# Patient Record
Sex: Male | Born: 1970
Health system: Southern US, Community
[De-identification: ages and names within clinical notes are randomized; demographics above are authoritative.]

## PROBLEM LIST (undated history)

## (undated) DIAGNOSIS — E7800 Pure hypercholesterolemia, unspecified: Secondary | ICD-10-CM

## (undated) DIAGNOSIS — M549 Dorsalgia, unspecified: Secondary | ICD-10-CM

## (undated) HISTORY — PX: KNEE SURGERY: SHX244

---

## 1998-08-26 ENCOUNTER — Emergency Department (HOSPITAL_COMMUNITY): Admission: EM | Admit: 1998-08-26 | Discharge: 1998-08-26 | Payer: Self-pay | Admitting: Emergency Medicine

## 1998-08-26 ENCOUNTER — Encounter: Payer: Self-pay | Admitting: Emergency Medicine

## 2003-02-02 ENCOUNTER — Ambulatory Visit (HOSPITAL_COMMUNITY): Admission: RE | Admit: 2003-02-02 | Discharge: 2003-02-02 | Payer: Self-pay | Admitting: Neurosurgery

## 2003-02-02 ENCOUNTER — Encounter: Payer: Self-pay | Admitting: Neurosurgery

## 2003-06-07 ENCOUNTER — Emergency Department (HOSPITAL_COMMUNITY): Admission: EM | Admit: 2003-06-07 | Discharge: 2003-06-08 | Payer: Self-pay | Admitting: Emergency Medicine

## 2005-04-27 ENCOUNTER — Emergency Department (HOSPITAL_COMMUNITY): Admission: EM | Admit: 2005-04-27 | Discharge: 2005-04-27 | Payer: Self-pay | Admitting: Family Medicine

## 2012-09-14 ENCOUNTER — Encounter: Payer: Self-pay | Admitting: Sports Medicine

## 2012-09-14 ENCOUNTER — Ambulatory Visit (INDEPENDENT_AMBULATORY_CARE_PROVIDER_SITE_OTHER): Payer: BC Managed Care – PPO | Admitting: Sports Medicine

## 2012-09-14 VITALS — BP 129/87 | HR 50 | Ht 71.0 in | Wt 174.0 lb

## 2012-09-14 DIAGNOSIS — M25561 Pain in right knee: Secondary | ICD-10-CM

## 2012-09-14 DIAGNOSIS — S76311A Strain of muscle, fascia and tendon of the posterior muscle group at thigh level, right thigh, initial encounter: Secondary | ICD-10-CM | POA: Insufficient documentation

## 2012-09-14 DIAGNOSIS — R0602 Shortness of breath: Secondary | ICD-10-CM

## 2012-09-14 DIAGNOSIS — M25569 Pain in unspecified knee: Secondary | ICD-10-CM | POA: Insufficient documentation

## 2012-09-14 NOTE — Assessment & Plan Note (Signed)
He was given a standard set of hamstring rehabilitation exercises  Since the pain extends down to the pes anserine bursa I think he would benefit from a knee compression sleeve  Check in 6-8 weeks to see how he is responding

## 2012-09-14 NOTE — Patient Instructions (Addendum)
Try using a knee compression sleeve You still have some excess motion medial RT knee Ice after activity Keep knee compression on for 30 mins post exercise OK to wash out in shower  HS exercises  Please follow up in 6 weeks  Thank you for seeing [place] today!

## 2012-09-14 NOTE — Assessment & Plan Note (Signed)
Because he has had relatively recent meniscus surgery 3 years ago we should have him be careful with certain activities His hip and quadriceps strength is excellent I think he would benefit from a compression sleeve particularly when running if that will help keep any swelling out from the medial joint line There is no sign of arthritis at this time  He should modify the pedal position to keep pressure off the knee during biking Avoid Too much standing in the saddle  Work on hamstring strength

## 2012-09-14 NOTE — Progress Notes (Signed)
Patient ID: Mathew Davila, male   DOB: [birth year], 42 y.o.   MRN: [id number]  RT knee had meniscus surgery 3 years ago Hurt for 6 mos after No swelling now No locking or giving way now  Biking - mtn bike first in past year road biking Medial knee pain after fit adjustment He reset this and had less pain Does 80-100 mpw   Uses compression sleeve on rt HS Has pain distal hamstring 30 mile ride/ 20 miles in some pain on a hill  Has noticed some shortness of breath when heart rate gets to 150-160 with exertion Mother and father both healthy and no high risk for HD in family   Physical exam: Athletic and NAD  Heart- RRR no murmur  Rt knee exam: Full knee extension and flexion Small arthroscopy scars on rt knee Clicking on medial joint line with Mcmurray's   Straight leg raise lt- 70 deg, rt 60 deg  Left 85 deg on H test Rt 85 deg on H test Weak on eccentric hamstrings Strong hip abductors   Exercise associated shortness of breath  Because of his desire to run a marathon and also push at a very high level in cycling I think he should do an exercise tolerance test to be sure this shortness of breath is not anything significant  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

## 2012-09-15 ENCOUNTER — Telehealth: Payer: Self-pay | Admitting: *Deleted

## 2012-09-15 NOTE — Telephone Encounter (Signed)
Per Dr. Darrick Penna scheduled pt for stress test 10/13/12 @ 1:30pm at [facility].  Pt notified of appt info.

## 2012-10-13 ENCOUNTER — Ambulatory Visit (HOSPITAL_COMMUNITY)
Admission: RE | Admit: 2012-10-13 | Discharge: 2012-10-13 | Disposition: A | Discharge status: Home / Self Care | Payer: BC Managed Care – PPO | Source: Ambulatory Visit | Attending: Sports Medicine | Admitting: Sports Medicine

## 2012-10-13 ENCOUNTER — Ambulatory Visit (HOSPITAL_BASED_OUTPATIENT_CLINIC_OR_DEPARTMENT_OTHER): Payer: BC Managed Care – PPO | Admitting: Sports Medicine

## 2012-10-13 DIAGNOSIS — R030 Elevated blood-pressure reading, without diagnosis of hypertension: Secondary | ICD-10-CM

## 2012-10-13 DIAGNOSIS — R0602 Shortness of breath: Secondary | ICD-10-CM | POA: Insufficient documentation

## 2012-10-13 NOTE — Progress Notes (Signed)
Patient ID: Mathew Davila, male   DOB: Nov 29, 1970, 42 y.o.   MRN: [id number]  Patient is brought in for an exercise tolerance test today  He is a serious biker whor rides up to 30-50 miles  On long rides  On steep hills and sometimes at the end of hard rides he has started noticing shortness of breath and extreme fatigue. This is not associated with palpitations or chest pain. He notes that this occurs at a heart rate of around 170.  He has a positive family history of hypertension in his grandparents there was diabetes and hypertension.  He has a history of mild mitral valve prolapse.  Examination  No acute distress  Heart reveals a regular rhythm and rate of 56. He does not have a murmur but does have a midsystolic click  Resting EKG reveals nonspecific ST and T wave changes  Bruce protocol ETT  Patient reached a heart rate of 170 and we terminated this test secondary to fatigue  He completed 11 minutes on the treadmill or approximately 13 METS  ST and T wave changes normalized and were unremarkable   no arrhythmia  Heart rate recovery at 1 minute was greater than 20 beats and greater than 50 beats at 2 minutes  Blood pressure maximum was 221/90 after a resting blood pressure of 142/80 After 3 minutes his systolic blood pressure only dropped to 217  Impression  Normal ETT with exception of exaggerated hypertensive response  Fitness level was about 60th percentile for age with an estimated VO2 max of 46

## 2012-10-13 NOTE — Assessment & Plan Note (Addendum)
I would like him to keep one-month of blood pressure data with his home monitor  I reassured him that his stress test was low risk for coronary artery disease  He does have a higher risk of hypertension based on result  I would also like to review his cholesterol as the only result I have seen from 3 years ago was slightly elevated  He does have possible mitral valve prolapse based on exam and based on history

## 2013-03-03 ENCOUNTER — Other Ambulatory Visit: Payer: Self-pay

## 2013-06-23 ENCOUNTER — Encounter (HOSPITAL_COMMUNITY): Payer: Self-pay | Admitting: Emergency Medicine

## 2013-06-23 ENCOUNTER — Emergency Department (HOSPITAL_COMMUNITY)
Admission: EM | Admit: 2013-06-23 | Discharge: 2013-06-23 | Disposition: A | Discharge status: Home / Self Care | Payer: 59 | Source: Home / Self Care | Attending: Emergency Medicine | Admitting: Emergency Medicine

## 2013-06-23 DIAGNOSIS — M5430 Sciatica, unspecified side: Secondary | ICD-10-CM

## 2013-06-23 DIAGNOSIS — M5431 Sciatica, right side: Secondary | ICD-10-CM

## 2013-06-23 HISTORY — DX: Dorsalgia, unspecified: M54.9

## 2013-06-23 MED ORDER — METAXALONE 800 MG PO TABS: 800.0000 mg | ORAL_TABLET | Freq: Three times a day (TID) | ORAL | Status: DC | PRN

## 2013-06-23 MED ORDER — METHYLPREDNISOLONE (PAK) 4 MG PO TABS: ORAL_TABLET | ORAL | Status: DC

## 2013-06-23 NOTE — ED Notes (Signed)
Patient reports crick in right side of neck that started 3 days ago, yesterday started having pain in right lower back.  Sitting is uncomfortable.  Pain radiates into right leg.  No known injury.  Yesterday, after back pain/hip started hurting, patient had a massage.  Today pain continues.  History of back issues

## 2013-06-23 NOTE — ED Provider Notes (Signed)
CSN: [id number]     Arrival date & time 06/23/13  1037 History   First MD Initiated Contact with Patient 06/23/13 1148     Chief Complaint  Patient presents with  . Back Pain     (Consider location/radiation/quality/duration/timing/severity/associated sxs/prior Treatment) HPI Comments: Patient reports lower back (R) muscle spasms with ambulation  Patient is a [age] y.o. male presenting with back pain. The history is provided by the patient.  Back Pain Location:  Sacro-iliac joint Quality:  Cramping Radiates to: right buttock. Pain severity:  Moderate Onset quality:  Gradual Duration:  3 days Timing:  Constant Progression:  Unchanged Chronicity:  New Relieved by: some relief with ibuprofen. Worsened by:  Sitting (and walking) Associated symptoms: no abdominal pain, no bladder incontinence, no bowel incontinence, no dysuria, no fever, no leg pain, no numbness, no paresthesias, no pelvic pain, no perianal numbness, no tingling, no weakness and no weight loss     Past Medical History  Diagnosis Date  . Back pain    History reviewed. No pertinent past surgical history. No family history on file. History  Substance Use Topics  . Smoking status: Never Smoker   . Smokeless tobacco: Never Used  . Alcohol Use: Yes    Review of Systems  Constitutional: Negative for fever and weight loss.  Gastrointestinal: Negative for abdominal pain and bowel incontinence.  Genitourinary: Negative for bladder incontinence, dysuria and pelvic pain.  Musculoskeletal: Positive for back pain.  Neurological: Negative for tingling, weakness, numbness and paresthesias.  All other systems reviewed and are negative.      Allergies  Review of patient's allergies indicates no known allergies.  Home Medications   Current Outpatient Rx  Name  Route  Sig  Dispense  Refill  . metaxalone (SKELAXIN) 800 MG tablet   Oral   Take 1 tablet (800 mg total) by mouth 3 (three) times daily as needed for muscle  spasms.   21 tablet   0   . methylPREDNIsolone (MEDROL DOSPACK) 4 MG tablet      follow package directions   21 tablet   0    BP 142/80  Pulse 56  Temp(Src) 97.7 F (36.5 C) (Oral)  Resp 14  SpO2 100% Physical Exam  Nursing note and vitals reviewed. Constitutional: He is oriented to person, place, and time. He appears well-developed and well-nourished. No distress.  HENT:  Head: Normocephalic and atraumatic.  Eyes: Conjunctivae are normal.  Neck: Normal range of motion. Neck supple.  Cardiovascular: Normal rate, regular rhythm and normal heart sounds.   Pulmonary/Chest: Effort normal and breath sounds normal.  Abdominal: Soft. Bowel sounds are normal. He exhibits no distension. There is no tenderness.  Musculoskeletal: Normal range of motion.       Lumbar back: He exhibits tenderness. He exhibits normal range of motion, no swelling, no edema, no deformity and no laceration.       Back:  Neurological: He is alert and oriented to person, place, and time.  Skin: Skin is warm and dry. No rash noted.  Psychiatric: He has a normal mood and affect. His behavior is normal.    ED Course  Procedures (including critical care time) Labs Review Labs Reviewed - No data to display Imaging Review No results found.    MDM   Final diagnoses:  Sciatica of right side  Sciatica on right: Medrol dose pack, tylenol and skelaxin as directed. Cautioned patient about red flags with regard to lower back pain. States he plans to follow up  with his PCP the first week in March 2015 if symptoms do not improve.    Jess Barters Gordon, [place] 06/23/13 1228

## 2013-06-24 NOTE — ED Provider Notes (Signed)
Medical screening examination/treatment/procedure(s) were performed by non-physician practitioner and as supervising physician I was immediately available for consultation/collaboration.  Blaze Nylund, M.D.  Hayzel Ruberg C Marshay Slates, MD 06/24/13 1236 

## 2013-07-12 ENCOUNTER — Encounter: Payer: Self-pay | Admitting: Sports Medicine

## 2013-07-12 ENCOUNTER — Ambulatory Visit (INDEPENDENT_AMBULATORY_CARE_PROVIDER_SITE_OTHER): Payer: 59 | Admitting: Sports Medicine

## 2013-07-12 VITALS — BP 130/81 | HR 55 | Ht 71.0 in | Wt 174.0 lb

## 2013-07-12 DIAGNOSIS — S76311A Strain of muscle, fascia and tendon of the posterior muscle group at thigh level, right thigh, initial encounter: Secondary | ICD-10-CM

## 2013-07-12 DIAGNOSIS — IMO0002 Reserved for concepts with insufficient information to code with codable children: Secondary | ICD-10-CM

## 2013-07-12 DIAGNOSIS — M25569 Pain in unspecified knee: Secondary | ICD-10-CM

## 2013-07-12 NOTE — Assessment & Plan Note (Signed)
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 Advil prn. F/u if worsens

## 2013-07-12 NOTE — Progress Notes (Signed)
Patient ID: Mathew Davila, male   DOB: 06/25/1970, 43 y.o.   MRN: [id number]    Subjective: HPI: Patient is a 43 y.o. male presenting to clinic today for evaluation of hamstring and knee pain.  1. Hamstring pain - Patient is a cyclist. He has noticed a stiffness/tightness in his right distal hamstring for the last several months. He states it is worse with pushing harder going up hill or with longer rides. He states he changed his bike position prior to his last ride and this helped with some of his pain. He has not noticed any swelling of the leg but it feels tighter to him on palpation. Of note, he also endorses some perineum pain and urinary frequency after rides. He was seen in the urgent care on 2/23 for sciatic pain on the right. He took a steroid dosepak with full resolution.   2. Knee pain - Patient has chronic medial knee pain on the right. He had a bucket handle meniscus tear 5+ years ago with some residual pain. It is exacerbated with cycling as well. No injury, swelling, redness, numbness or tingling. He recently shortened his stride on the bike which did help.   History Reviewed: Non smoker.  ROS: Please see HPI above.  Objective: Office vital signs reviewed. BP 130/81  Pulse 55  Ht 5\' 11"  (1.803 m)  Wt 174 lb (78.926 kg)  BMI 24.28 kg/m2  Physical Examination:  General: Awake, alert. NAD. Able to move on and off table without difficulty  Hips: - Stable - FROM bilaterally  Right leg: - Excellent hamstring strength - Negative H test - TTP of medial side just proximal to popliteal fossa  Right knee: - No effusion, redness or gross deformity - Stable joint - TTP medial joint line and just proximal to that  Neuro: Grossly intact distally  Assessment: 43 y.o. male hamstring area pain that I feel may be 2/2 nerve compression of sciatic with pressure on bike seat  Plan: See Problem List and After Visit Summary

## 2013-07-12 NOTE — Assessment & Plan Note (Signed)
Distal hamstring/tendon pain noticed with exertion on bike. - Adjust position on bike - Consider new seat - Given [facility] for strengthening hamstring - NSAID if pain worsens - Consider adding B6 and Vit C supplements - Establish PCP

## 2013-07-12 NOTE — Patient Instructions (Signed)
Consider getting a new bike seat. Trust the [employer] store for your bike fitting to decrease your stride and pedal placement.  For your hamstring, do the exercises to strengthen your hamstring muscles to keep it healthy. - Extender (lying down and extend leg):  4 sets: 8 reps, hold for count of 5. Two days per week - Diver (Stand on right leg and bend forward towards your toe) 4 sets: 6 reps, hold for count of 5. Do 4 times per week - Glider (Stand on right leg and do a reverse lunge) 3 sets: 8 reps. Three days per week  Try the following supplements: B6 100mg  daily Vitamin C 500mg  daily  Call [facility] to establish care with either Dr. Cleta Albertsaub or Dr. Neva SeatGreene [phone]).

## 2013-08-15 ENCOUNTER — Other Ambulatory Visit: Payer: Self-pay | Admitting: *Deleted

## 2013-08-15 DIAGNOSIS — M25569 Pain in unspecified knee: Secondary | ICD-10-CM

## 2013-08-17 ENCOUNTER — Ambulatory Visit
Admission: RE | Admit: 2013-08-17 | Discharge: 2013-08-17 | Disposition: A | Discharge status: Home / Self Care | Payer: 59 | Source: Ambulatory Visit | Attending: Sports Medicine | Admitting: Sports Medicine

## 2013-08-17 DIAGNOSIS — M25569 Pain in unspecified knee: Secondary | ICD-10-CM

## 2013-08-19 ENCOUNTER — Other Ambulatory Visit: Payer: 59

## 2013-08-22 ENCOUNTER — Ambulatory Visit
Admission: RE | Admit: 2013-08-22 | Discharge: 2013-08-22 | Disposition: A | Discharge status: Home / Self Care | Payer: 59 | Source: Ambulatory Visit | Attending: Sports Medicine | Admitting: Sports Medicine

## 2013-08-22 DIAGNOSIS — M25569 Pain in unspecified knee: Secondary | ICD-10-CM

## 2013-08-30 ENCOUNTER — Ambulatory Visit (INDEPENDENT_AMBULATORY_CARE_PROVIDER_SITE_OTHER): Payer: 59 | Admitting: Sports Medicine

## 2013-08-30 ENCOUNTER — Encounter: Payer: Self-pay | Admitting: Sports Medicine

## 2013-08-30 VITALS — BP 117/75 | Ht 71.0 in | Wt 173.0 lb

## 2013-08-30 DIAGNOSIS — S76311A Strain of muscle, fascia and tendon of the posterior muscle group at thigh level, right thigh, initial encounter: Secondary | ICD-10-CM

## 2013-08-30 DIAGNOSIS — M25569 Pain in unspecified knee: Secondary | ICD-10-CM

## 2013-08-30 DIAGNOSIS — IMO0002 Reserved for concepts with insufficient information to code with codable children: Secondary | ICD-10-CM

## 2013-08-30 DIAGNOSIS — S83241A Other tear of medial meniscus, current injury, right knee, initial encounter: Secondary | ICD-10-CM | POA: Insufficient documentation

## 2013-08-30 NOTE — Progress Notes (Signed)
Patient ID: Mathew Davila, male   DOB: 07/03/1970, 43 y.o.   MRN: [id number]  The patient has had right hamstring pain and medial RT knee pain that started about one year ago The started primarily with an increase in his cycling  He did exercises for his hamstring and improved  Then about 8 weeks ago we saw him for the medial knee pain that did not respond to conservative care For this reason we got an MRI and this did show some new meniscal tearing He has a history of meniscal tear about 5-6 years ago that required removal of significant fragment on the right  He uses the compression sleeve for walking or running and has less knee pain  No mechanical symptoms since last being seen  He is able to do moderate intensity biking without pain  Physical examination No acute distress BP 117/75  Ht 5\' 11"  (1.803 m)  Wt 173 lb (78.472 kg)  BMI 24.14 kg/m2  RT Knee: Normal to inspection with no erythema or effusion or obvious bony abnormalities. Palpation normal with no warmth or joint line tenderness or patellar tenderness or condyle tenderness. ROM normal in flexion and extension and lower leg rotation. Ligaments with solid consistent endpoints including ACL, PCL, LCL, MCL. Negative Mcmurray's and provocative meniscal tests. He does get some crepitation when I test his medial joint line Non painful patellar compression. Patellar and quadriceps tendons unremarkable. Hamstring and quadriceps strength is normal.  MRI is reviewed with the patient and shows only a small effusion but a new meniscal tear

## 2013-08-30 NOTE — Assessment & Plan Note (Signed)
Pain continues to improve  Use the compression sleeve and icing  Over-the-counter medications if needed

## 2013-08-30 NOTE — Assessment & Plan Note (Signed)
Since this seems to be gradually improving with no instability or mechanical symptoms I think he can continue conservative care  I want him to keep his biking to moderate level for the next 2-3 months  We should recheck him at that time to see if he is ready to increase his training

## 2013-08-30 NOTE — Patient Instructions (Signed)
Keep up some hamstring exercise  Keep biking to a moderate level for 6 to 12 weeks Riding is good therapy But don't push gears Don't stand for very long Run on softer surface if you can  Avoid deep knee bend Use compression more for walking or standing too long For biking use only in recovery  See me in ~ 8 weeks before any hard training

## 2013-08-30 NOTE — Assessment & Plan Note (Signed)
Keep up some maintenance exercises for his hamstring strength

## 2013-10-25 ENCOUNTER — Other Ambulatory Visit: Payer: 59 | Admitting: Sports Medicine

## 2013-11-01 ENCOUNTER — Ambulatory Visit (INDEPENDENT_AMBULATORY_CARE_PROVIDER_SITE_OTHER): Payer: 59 | Admitting: Sports Medicine

## 2013-11-01 ENCOUNTER — Encounter: Payer: Self-pay | Admitting: Sports Medicine

## 2013-11-01 VITALS — BP 114/71 | Ht 71.0 in | Wt 172.0 lb

## 2013-11-01 DIAGNOSIS — M25561 Pain in right knee: Secondary | ICD-10-CM

## 2013-11-01 DIAGNOSIS — Z5189 Encounter for other specified aftercare: Secondary | ICD-10-CM

## 2013-11-01 DIAGNOSIS — M25569 Pain in unspecified knee: Secondary | ICD-10-CM

## 2013-11-01 DIAGNOSIS — S83241D Other tear of medial meniscus, current injury, right knee, subsequent encounter: Secondary | ICD-10-CM

## 2013-11-01 DIAGNOSIS — IMO0002 Reserved for concepts with insufficient information to code with codable children: Secondary | ICD-10-CM

## 2013-11-01 NOTE — Progress Notes (Signed)
Patient ID: Mathew Davila, male   DOB: 06/20/1970, 43 y.o.   MRN: [id number]  The patient has had medial RT knee pain that started about one year ago The started primarily with an increase in his cycling MRI and this did show some new meniscal tearing  He has a history of meniscal tear about 5-6 years ago that required removal of significant fragment on the right  He uses the compression sleeve for walking or running and has less knee pain No mechanical symptoms since last being seen He has been able to increase his cycling distance to 50 miles max currently with minimal pain. He reports no joint effusion present or soft tissue edema.  His hamstring weakness and pain resolved   Physical examination No acute distress BP 114/71  Ht 5\' 11"  (1.803 m)  Wt 172 lb (78.019 kg)  BMI 24.00 kg/m2  RT Knee: Normal to inspection with no erythema or effusion or obvious bony abnormalities. Palpation normal with no warmth or joint line tenderness or patellar tenderness or condyle tenderness. ROM normal in flexion 145 deg and extension 0 deg and lower leg rotation. Ligaments with solid consistent endpoints including ACL, PCL, LCL, MCL. Negative Mcmurray's and provocative meniscal tests.  He does get some crepitation along the medial joint line Non painful patellar compression. Patellar and quadriceps tendons unremarkable. Hamstring and quadriceps strength is normal.   Assessment: Medial mensicus tear with joint effusion from biking. Patient effusion and pain have resolved he has been able to gradually increase his mileage and gears. Recommend releasing him back to normal activity. Recommend continue gradual increase in miles on biking, can return to running with minimal hills and stairs.  Plan/ instructions: Keep up some hamstring exercise  Keep biking with gradual increase in miles Riding is good therapy  Run on softer surface if you can  Avoid deep knee bend  Use compression more for  walking or standing too long  Stretch well after biking

## 2013-11-01 NOTE — Assessment & Plan Note (Signed)
Clinically improved with conservative Tx  Will follow  Reck if return of sxs

## 2013-11-01 NOTE — Assessment & Plan Note (Signed)
Assessment: Medial mensicus tear with joint effusion from biking. Patient effusion and pain have resolved he has been able to gradually increase his mileage and gears. Recommend releasing him back to normal activity. Recommend continue gradual increase in miles on biking, can return to running with minimal hills and stairs.  Plan/ instructions: Keep up some hamstring exercise  Keep biking with gradual increase in miles Riding is good therapy  Run on softer surface if you can  Avoid deep knee bend  Use compression more for walking or standing too long  Stretch well after biking

## 2014-01-26 ENCOUNTER — Encounter: Payer: Self-pay | Admitting: Sports Medicine

## 2014-01-26 ENCOUNTER — Ambulatory Visit (INDEPENDENT_AMBULATORY_CARE_PROVIDER_SITE_OTHER): Payer: 59 | Admitting: Sports Medicine

## 2014-01-26 VITALS — BP 132/82 | HR 51 | Ht 71.0 in | Wt 173.0 lb

## 2014-01-26 DIAGNOSIS — S4351XA Sprain of right acromioclavicular joint, initial encounter: Secondary | ICD-10-CM | POA: Insufficient documentation

## 2014-01-26 DIAGNOSIS — M25511 Pain in right shoulder: Secondary | ICD-10-CM

## 2014-01-26 MED ORDER — AMITRIPTYLINE HCL 25 MG PO TABS: 25.0000 mg | ORAL_TABLET | Freq: Every day | ORAL | Status: DC
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
## 2014-01-26 NOTE — Assessment & Plan Note (Signed)
An effusion was noted to be around the [facility] joint indicating that his pain is most likely the result of an AC sprain. He had some mild weakness on external rotation but no tears were noted on US. Nothing observed on US or on exam to suggest Thoracic outlet syndrome.  - amitriptyline 25 mg QHS #30  - home exercises given  - advised he may exercise but limited activity with weights above his head and stop anytime pain occurs.  - f/u in 6 weeks to rescan and determine improvement of symptoms.

## 2014-02-22 ENCOUNTER — Ambulatory Visit: Payer: 59 | Admitting: Sports Medicine

## 2015-10-25 ENCOUNTER — Ambulatory Visit (INDEPENDENT_AMBULATORY_CARE_PROVIDER_SITE_OTHER): Payer: 59 | Admitting: Sports Medicine

## 2015-10-25 ENCOUNTER — Encounter: Payer: Self-pay | Admitting: Sports Medicine

## 2015-10-25 VITALS — BP 122/79 | HR 49 | Ht 71.0 in | Wt 175.0 lb

## 2015-10-25 DIAGNOSIS — S4351XA Sprain of right acromioclavicular joint, initial encounter: Secondary | ICD-10-CM | POA: Diagnosis not present

## 2015-10-25 MED ORDER — DICLOFENAC SODIUM 1 % TD GEL
2.0000 g | Freq: Four times a day (QID) | TRANSDERMAL | Status: DC
Start: 2015-10-25 — End: 2017-06-12

## 2015-10-25 NOTE — Progress Notes (Signed)
  Subjective:   Mathew Davila is a 45 y.o. male with a history of R AC joint sprain in 2015 here for R shoulder pain.  Patient was feeling well and began weight lifting again 5-6 months ago.  He noted pain in R anterior shoulder that felt similar to previous pain with AC joint sprain ~3-4 months ago, especially with overhead press, fly, and bench press.  He stopped lifting weights about 1 month ago.  He is still having pain, especially when laying on R side during sleep.  This will awaken him from sleep.  He has not tried any medications or ice.Denies neck pain, weakness.  Does have intermittent tingling in hands, present prior to shoulder pain.  Review of Systems:  Per HPI.   Social History: never smoker  Objective:  BP 122/79 mmHg  Pulse 49  Ht 5\' 11"  (1.803 m)  Wt 175 lb (79.379 kg)  BMI 24.42 kg/m2  Gen:  45 y.o. male in NAD, fit appearing MSK: R Shoulder: Inspection reveals no abnormalities, atrophy or asymmetry. Palpation is normal with no tenderness over AC joint or bicipital groove. ROM is full in all planes. Rotator cuff strength normal throughout. No signs of impingement with negative Neer's tests, empty can. Speeds and Yergason's tests normal. + crossover test + resisted shoulder adduction test  MSK US R shoulder (10/25/2015): R shoulder with intact rotator cuff tendons.  Small effusion and sprain (clavicle riding higher than acromion) of R AC joint, not present in L shoulder.      Assessment & Plan:     Mathew Davila is a 45 y.o. male here for R shoulder AC joint sprain.  Sprain of right acromioclavicular joint US and special testing on exam consistent with AC joint sprain of R shoulder Rotator cuff intact Advised no overhead lifting and wide grips for bench press Avoid sleeping on this shoulder Voltaren gel qid Ice after workouts F/u in 6 wks if not better and consider cortisone injection at that time    Erasmo DownerAngela M Mahaley Schwering, MD MPH PGY-2,   [facility] 10/25/2015  4:55 PM    Agree with assessment and evaluation.  Sterling BigKB Fields, MD

## 2015-10-25 NOTE — Assessment & Plan Note (Addendum)
US and special testing on exam consistent with chronic AC joint sprain of R shoulder/ probable early DJD Rotator cuff intact Advised no overhead lifting and wide grips for bench press Avoid sleeping on this shoulder Voltaren gel qid Ice after workouts F/u in 6 wks if not better and consider cortisone injection at that time

## 2015-11-28 ENCOUNTER — Encounter: Payer: Self-pay | Admitting: Sports Medicine

## 2015-11-28 ENCOUNTER — Ambulatory Visit (INDEPENDENT_AMBULATORY_CARE_PROVIDER_SITE_OTHER): Payer: 59 | Admitting: Sports Medicine

## 2015-11-28 DIAGNOSIS — S4351XD Sprain of right acromioclavicular joint, subsequent encounter: Secondary | ICD-10-CM

## 2015-11-28 MED ORDER — TRIAMCINOLONE ACETONIDE 10 MG/ML IJ SUSP
5.0000 mg | Freq: Once | INTRAMUSCULAR | Status: AC
  Administered 2015-11-28: 5 mg via INTRA_ARTICULAR

## 2015-11-28 NOTE — Assessment & Plan Note (Signed)
Injection performed today  - f/u in 6 weeks to re-scan his [facility] joint to monitor for improvement.

## 2015-11-28 NOTE — Progress Notes (Signed)
  Mathew Davila - 45 y.o. male MRN [id number]  Date of birth: 1970/08/04  Patient is following up in regards to his [facility] joint separation, please refer to note from 10/25/15 for history and exam.   Aspiration/Injection Procedure Note Mathew Davila [birth year]  Procedure: Injection Indications: AC joint separation  Procedure Details Consent: Risks of procedure as well as the alternatives and risks of each were explained to the (patient/caregiver).  Consent for procedure obtained. Time Out: Verified patient identification, verified procedure, site/side was marked, verified correct patient position, special equipment/implants available, medications/allergies/relevent history reviewed, required imaging and test results available.  Performed.  The area was cleaned with iodine and alcohol swabs.    The right AC joint was injected using 0.5 cc's of 40 mg Depomedrol and 1 cc's of 1% lidocaine with a 30 1" needle.  Ultrasound was used. Images were obtained in Transverse view showing the injection.    Patient did tolerate procedure well.  ASSESSMENT & PLAN:   Sprain of right acromioclavicular joint Injection performed today  - f/u in 6 weeks to re-scan his [facility] joint to monitor for improvement.

## 2017-05-05 ENCOUNTER — Encounter (HOSPITAL_COMMUNITY): Payer: Self-pay | Admitting: Family Medicine

## 2017-05-05 ENCOUNTER — Ambulatory Visit (HOSPITAL_COMMUNITY)
Admission: EM | Admit: 2017-05-05 | Discharge: 2017-05-05 | Disposition: A | Discharge status: Home / Self Care | Payer: 59 | Attending: Emergency Medicine | Admitting: Emergency Medicine

## 2017-05-05 DIAGNOSIS — R42 Dizziness and giddiness: Secondary | ICD-10-CM | POA: Diagnosis not present

## 2017-05-05 DIAGNOSIS — R0602 Shortness of breath: Secondary | ICD-10-CM

## 2017-05-05 LAB — GLUCOSE, CAPILLARY: Glucose-Capillary: 106 mg/dL — ABNORMAL HIGH (ref 65–99)

## 2017-05-05 NOTE — ED Triage Notes (Signed)
Pt here for dizziness, SOB, body aches, and nausea since last night.

## 2017-05-05 NOTE — Discharge Instructions (Signed)
Please continue to monitor your symptoms for changes. Please come back or go to the emergency room if symptoms not improving or worsening, develop shortness of breath, chest pain, increased dizziness or lightheadedness.

## 2017-05-05 NOTE — ED Provider Notes (Signed)
[facility]    CSN: [id number] Arrival date & time: 05/05/17  1206     History   Chief Complaint Chief Complaint  Patient presents with  . Nausea  . Generalized Body Aches  . Shortness of Breath    HPI Mathew Davila is a 47 y.o. male presenting with acute onset last night of nausea, shortness or breath and dizziness. This awakened him from sleep and he felt winded. He states he felt he had to think about breathing. Dizziness was more associated with shortness of breath, denies room spinning, denies syncope. Believe he may have possible sleep apnea. He was able to go back to sleep for 2-3 hours and when he woke up his symptoms had improved, but still lingering. He still feels some SOB and occasional dizziness. Denies smoking, cocaine use. Does endorse occasional marijuana vaping to try and help with sleep as he has persistent issues with sleep. No personal history of high blood pressure, although he states readings have been elevated in past but was going to try lifestyle modifications to bring back down. Denies DM. Does have a family history of high blood pressure. Neither parent have had CAD/MI, grandfather has had an MI in his 41's. Does feel he has had more stress of recently due to children and holidays. Denies chest pain during these episodes. No recent illnesses.  HPI  Past Medical History:  Diagnosis Date  . Back pain     Patient Active Problem List   Diagnosis Date Noted  . Sprain of right acromioclavicular joint 01/26/2014  . Acute medial meniscus tear of right knee 08/30/2013  . Blood pressure elevated without history of HTN 10/13/2012  . Medial knee pain 09/14/2012  . Right hamstring muscle strain 09/14/2012    History reviewed. No pertinent surgical history.     Home Medications    Prior to Admission medications   Medication Sig Start Date End Date Taking? Authorizing Provider  diclofenac sodium (VOLTAREN) 1 % GEL Apply 2 g topically 4  (four) times daily. 10/25/15   Enid Baas, MD  finasteride (PROPECIA) 1 MG tablet Take 1 mg by mouth.    [provider]  ibuprofen (ADVIL,MOTRIN) 200 MG tablet Take 200 mg by mouth every 6 (six) hours as needed.    [provider]    Family History History reviewed. No pertinent family history.  Social History Social History   Tobacco Use  . Smoking status: Never Smoker  . Smokeless tobacco: Never Used  Substance Use Topics  . Alcohol use: Yes  . Drug use: No     Allergies   Patient has no known allergies.   Review of Systems Review of Systems  Constitutional: Negative for chills and fever.  HENT: Negative for congestion, ear pain and sore throat.   Eyes: Negative for pain and visual disturbance.  Respiratory: Positive for shortness of breath. Negative for cough.   Cardiovascular: Negative for chest pain and palpitations.  Gastrointestinal: Positive for nausea. Negative for abdominal pain and vomiting.  Musculoskeletal: Negative for arthralgias and back pain.  Skin: Negative for color change and rash.  Neurological: Positive for dizziness and light-headedness. Negative for syncope and weakness.  All other systems reviewed and are negative.    Physical Exam Triage Vital Signs ED Triage Vitals [05/05/17 1238]  Enc Vitals Group     BP (!) 149/83     Pulse Rate 73     Resp 18     Temp (!) 97.4 F (36.3  C)     Temp Source Oral     SpO2 100 %     Weight      Height      Head Circumference      Peak Flow      Pain Score      Pain Loc      Pain Edu?      Excl. in GC?    No data found.  Updated Vital Signs BP (!) 149/83 (BP Location: Left Arm)   Pulse 73   Temp (!) 97.4 F (36.3 C) (Oral)   Resp 18   SpO2 100%    Physical Exam  Constitutional: He appears well-developed and well-nourished.  HENT:  Head: Normocephalic and atraumatic.  Right Ear: Tympanic membrane and ear canal normal.  Left Ear: Tympanic membrane and ear canal  normal.  Mouth/Throat: Oropharynx is clear and moist.  Eyes: Conjunctivae and EOM are normal. Pupils are equal, round, and reactive to light.  Neck: Neck supple.  Cardiovascular: Normal rate and regular rhythm.  No murmur heard. Pulmonary/Chest: Effort normal and breath sounds normal. No respiratory distress. He has no decreased breath sounds. He has no wheezes. He has no rhonchi. He has no rales.  Breathing comfortably at rest, does not appear to be consciously thinking about breathing  Abdominal: Soft. He exhibits no distension. There is no tenderness. There is no guarding.  Musculoskeletal: He exhibits no edema.  Neurological: He is alert. No cranial nerve deficit.  Skin: Skin is warm and dry.  Psychiatric: He has a normal mood and affect.  Nursing note and vitals reviewed.    UC Treatments / Results  Labs (all labs ordered are listed, but only abnormal results are displayed) Labs Reviewed  GLUCOSE, CAPILLARY - Abnormal; Notable for the following components:      Result Value   Glucose-Capillary 106 (*)    All other components within normal limits    EKG  EKG Interpretation None      EKG with t-wave inversions in leads II, III, avF and V4/V5. Non-specific t-wave inversions present on Stress test 3 years ago- inversions larger today.   Radiology No results found.  Procedures Procedures (including critical care time)  Medications Ordered in UC Medications - No data to display   Initial Impression / Assessment and Plan / UC Course  I have reviewed the triage vital signs and the nursing notes.  Pertinent labs & imaging results that were available during my care of the patient were reviewed by me and considered in my medical decision making (see chart for details).     Patient presenting with acute episode of SOB, nausea and dizziness. Unclear cause of symptoms, may be related to sleep apnea, advised to follow up with PCP to discuss need for sleep study/referral. EKG  with t-wave inversions, appear to be present on previous stress test, negative risk factors except for possible undiagnosed HTN. Episodes not associated with chest pain- patient more concerned about breathing. No cough, no swelling, no recent illness, O2 saturation of 100% without effort- unlikely intrapulmonary cause. Advised to monitor symptoms, Discussed strict return precautions. Patient verbalized understanding and is agreeable with plan.   Final Clinical Impressions(s) / UC Diagnoses   Final diagnoses:  Shortness of breath    ED Discharge Orders    None       Controlled Substance Prescriptions [place] Controlled Substance Registry consulted? Not Applicable   Lew DawesWieters, Yvette Loveless C, [place] JerseyPA-C 05/05/17 1636

## 2017-06-12 ENCOUNTER — Ambulatory Visit: Payer: 59 | Admitting: Physician Assistant

## 2017-06-12 ENCOUNTER — Encounter: Payer: Self-pay | Admitting: Physician Assistant

## 2017-06-12 VITALS — BP 126/78 | HR 53 | Ht 71.0 in | Wt 185.6 lb

## 2017-06-12 DIAGNOSIS — R0602 Shortness of breath: Secondary | ICD-10-CM | POA: Diagnosis not present

## 2017-06-12 DIAGNOSIS — R079 Chest pain, unspecified: Secondary | ICD-10-CM

## 2017-06-12 NOTE — Progress Notes (Signed)
Cardiology Office Note    Date:  06/13/2017   ID:  Mathew Davila, DOB 10/19/1970, MRN [id number]  PCP:  Mathew Heys, MD  Cardiologist:  New - remotely seen by Dr. Jens Davila, case discussed with DOD DR. [place]   Chief Complaint  Patient presents with  . New Patient (Initial Visit)    case discussed with DOD Dr. [place]    History of Present Illness:  Mathew Davila is a 47 y.o. male with no past medical history who presented for evaluation of shortness of breath that wake him up from sleep at night.  He is extremely active and just finished 22 mile bicycle exercise yesterday.  He plans to ride his road bike again as afternoon.  He was remotely seen by Dr. Jens Davila in the past, medical record is not available as it was before the electronic medical record system.  He apparently had a brief episode of bigeminy, however this has resolved without further recurrence since.  He also mentioned a possible infection of the lining of the heart more than 15 years ago, I am not sure if he is describing something like pericarditis.  He has remote family history of CAD on his father's side among his uncle and grandparents, but both his father and mother are living and healthy except his father has hypertension.  He himself denies any high blood pressure, high cholesterol and diabetes.  He does have occasional sharp chest pain which only last a few seconds each time and may not occur but once a month.  It does not occur with exertion and so far he has not experienced any episode longer than a minute.  More recently, he went to the local urgent care on 05/05/2017 for evaluation of shortness of breath that woke him up at night.  He says the symptoms lasted a few minutes before he went back to sleep.  He is wondering if he has obstructive sleep apnea.  He is very fit, although this cannot completely rule out potential structural abnormality in the back of throat can cause snoring issue.  I recommended  him to discuss this with his primary care provider.  He does not appears to have any heart failure signs and symptoms on physical exam.  However his EKG obtained by urgent care does show significant T wave inversions in inferolateral leads.  I discussed his case with DOD Dr. [place], we recommend a stress echocardiogram.  If stress echocardiogram is negative, no further workup is needed.   Past Medical History:  Diagnosis Date  . Back pain     Past Surgical History:  Procedure Laterality Date  . KNEE SURGERY Right    right meniscus    Current Medications: Outpatient Medications Prior to Visit  Medication Sig Dispense Refill  . finasteride (PROPECIA) 1 MG tablet Take 1 mg by mouth.    Marland Kitchen ibuprofen (ADVIL,MOTRIN) 200 MG tablet Take 200 mg by mouth every 6 (six) hours as needed.    . diclofenac sodium (VOLTAREN) 1 % GEL Apply 2 g topically 4 (four) times daily. 100 g 1   No facility-administered medications prior to visit.      Allergies:   Patient has no known allergies.   Social History   Socioeconomic History  . Marital status: Married    Spouse name: None  . Number of children: None  . Years of education: None  . Highest education level: None  Social Needs  . Financial resource strain: None  . Food  insecurity - worry: None  . Food insecurity - inability: None  . Transportation needs - medical: None  . Transportation needs - non-medical: None  Occupational History  . None  Tobacco Use  . Smoking status: Never Smoker  . Smokeless tobacco: Never Used  Substance and Sexual Activity  . Alcohol use: Yes  . Drug use: No  . Sexual activity: None  Other Topics Concern  . None  Social History Narrative  . None     Family History:  The patient's family history includes Heart attack (age of onset: 5464) in his paternal grandfather; Hypertension in his father.   ROS:   Please see the history of present illness.    ROS All other systems reviewed and are  negative.   PHYSICAL EXAM:   VS:  BP 126/78   Pulse (!) 53   Ht 5\' 11"  (1.803 m)   Wt 185 lb 9.6 oz (84.2 kg)   BMI 25.89 kg/m    GEN: Well nourished, well developed, in no acute distress  HEENT: normal  Neck: no JVD, carotid bruits, or masses Cardiac: RRR; no murmurs, rubs, or gallops,no edema  Respiratory:  clear to auscultation bilaterally, normal work of breathing GI: soft, nontender, nondistended, + BS MS: no deformity or atrophy  Skin: warm and dry, no rash Neuro:  Alert and Oriented x 3, Strength and sensation are intact Psych: euthymic mood, full affect  Wt Readings from Last 3 Encounters:  06/12/17 185 lb 9.6 oz (84.2 kg)  11/28/15 175 lb (79.4 kg)  10/25/15 175 lb (79.4 kg)      Studies/Labs Reviewed:   EKG:  EKG is not ordered today.    Recent Labs: No results found for requested labs within last 8760 hours.   Lipid Panel No results found for: CHOL, TRIG, HDL, CHOLHDL, VLDL, LDLCALC, LDLDIRECT  Additional studies/ records that were reviewed today include:   Previous note by local urgent care and EKG obtained at Urgent Care   ASSESSMENT:    1. Shortness of breath   2. Chest pain, unspecified type      PLAN:  In order of problems listed above:  1. Episodic shortness of breath: Although symptom of periodic shortness of breath that wake him up sounds like PND, however he does not have any heart failure signs on physical exam.  He is worried about possible obstructive sleep apnea.  Mr. Mathew SomCrenshaw is extremely fit and ride bicycles almost on a daily basis.  Although I cannot rule out possible structural abnormality in the back of his throat that is capable of causing snoring issues.  I asked him to discuss this with his primary care provider.  2. Chest pain: Suspicion of ACS is extremely low.  Patient does not have significant cardiac risk factors.  He does not have any hypertension, hyperlipidemia or diabetes.  His symptoms seems to be quite atypical and  lasting only seconds at a time.  I discussed the case with DOD Dr. [place], given episodic shortness of breath and chest pain, I recommended a stress echocardiogram.  If stress echocardiogram is normal, I would not recommend further workup in this case.    Medication Adjustments/Labs and Tests Ordered: Current medicines are reviewed at length with the patient today.  Concerns regarding medicines are outlined above.  Medication changes, Labs and Tests ordered today are listed in the Patient Instructions below. Patient Instructions  Medication Instructions: Your physician recommends that you continue on your current medications as directed. Please refer to  the Current Medication list given to you today.   Testing/Procedures: Your physician has requested that you have a stress echocardiogram. For further information please visit [email or web address] Please follow instruction sheet as given.  Follow-Up: Your physician recommends that you schedule a follow-up appointment as needed if normal results. We will call you with the results and let you know when or if follow-up is needed.       Ramond Dial, [place]  06/13/2017 3:44 PM    [facility] [address] [place], [place], [place]  [address] Phone: [phone]; Fax: [phone]

## 2017-06-12 NOTE — Patient Instructions (Signed)
Medication Instructions: Your physician recommends that you continue on your current medications as directed. Please refer to the Current Medication list given to you today.   Testing/Procedures: Your physician has requested that you have a stress echocardiogram. For further information please visit [email or web address]. Please follow instruction sheet as given.  Follow-Up: Your physician recommends that you schedule a follow-up appointment as needed if normal results. We will call you with the results and let you know when or if follow-up is needed.

## 2017-06-13 ENCOUNTER — Encounter: Payer: Self-pay | Admitting: Physician Assistant

## 2017-06-25 ENCOUNTER — Telehealth (HOSPITAL_COMMUNITY): Payer: Self-pay | Admitting: *Deleted

## 2017-06-25 NOTE — Telephone Encounter (Signed)
Patient given detailed instructions per Stress Test Requisition Sheet for test on 07/01/17 at 2:30.Patient Notified to arrive 30 minutes early, and that it is imperative to arrive on time for appointment to keep from having the test rescheduled.  Patient verbalized understanding. Daneil DolinSharon S Brooks

## 2017-07-01 ENCOUNTER — Ambulatory Visit (HOSPITAL_BASED_OUTPATIENT_CLINIC_OR_DEPARTMENT_OTHER): Payer: 59

## 2017-07-01 ENCOUNTER — Ambulatory Visit (HOSPITAL_COMMUNITY): Payer: 59 | Attending: Cardiology

## 2017-07-01 DIAGNOSIS — R079 Chest pain, unspecified: Secondary | ICD-10-CM | POA: Diagnosis not present

## 2017-07-01 DIAGNOSIS — R0602 Shortness of breath: Secondary | ICD-10-CM | POA: Diagnosis not present

## 2017-07-01 NOTE — Progress Notes (Signed)
Normal stress echo, normal pumping function of heart. Followup with cardiology only on an as needed basis

## 2018-02-25 ENCOUNTER — Ambulatory Visit: Payer: Self-pay | Admitting: Sports Medicine

## 2018-03-02 ENCOUNTER — Ambulatory Visit (INDEPENDENT_AMBULATORY_CARE_PROVIDER_SITE_OTHER): Payer: BLUE CROSS/BLUE SHIELD | Admitting: Sports Medicine

## 2018-03-02 VITALS — BP 126/82 | Ht 70.0 in | Wt 176.0 lb

## 2018-03-02 DIAGNOSIS — M5010 Cervical disc disorder with radiculopathy, unspecified cervical region: Secondary | ICD-10-CM

## 2018-03-02 DIAGNOSIS — M542 Cervicalgia: Secondary | ICD-10-CM | POA: Diagnosis not present

## 2018-03-02 MED ORDER — GABAPENTIN 300 MG PO CAPS: 300.0000 mg | ORAL_CAPSULE | Freq: Every evening | ORAL | 1 refills | Status: DC | PRN

## 2018-03-02 MED ORDER — PREDNISONE 10 MG PO TABS: ORAL_TABLET | ORAL | 0 refills | Status: DC

## 2018-03-03 ENCOUNTER — Encounter: Payer: Self-pay | Admitting: Sports Medicine

## 2018-03-03 NOTE — Progress Notes (Signed)
   Subjective:    Patient ID: Mathew Davila, male    DOB: 1970/08/16, 47 y.o.   MRN: [id number]  HPI chief complaint: Neck pain  Very pleasant 47 year old male comes in today complaining of 2 weeks of right-sided neck pain.  He woke up one morning with the pain.  It has intensified over the past couple of weeks.  He describes an aching discomfort along the right trapezius with radiating discomfort down the arm into the radial aspect of the right hand.  He does endorse numbness and tingling into his hand and fingers.  He describes the pain in his right arm as a "charley horse type of feeling".  He is tried over-the-counter Advil without any symptom relief.  He enjoys cycling and kickboxing but has stopped both of those activities.  He had neck pain and left arm numbness in 2004.  An MRI done at that time showed a small focal central disc protrusion at C5-C6 as well as a small disc bulge at C6-C7.  No surgery was performed.  He has done well with his neck up until recently.  He is having difficulty sleeping due to his pain.  He denies weakness.  He is right-hand dominant.  Past medical history reviewed Medications reviewed Allergies reviewed    Review of Systems    As above Objective:   Physical Exam  Well-developed, well-nourished.  No acute distress.  Awake alert and oriented x3.  Vital signs reviewed  Cervical spine: Patient does have some slightly limited cervical rotation to the right.  Full cervical rotation to the left.  Good flexion and extension.  Positive Spurling's to the right.  No tenderness along the cervical midline.  There is tenderness to palpation diffusely along the right paraspinal musculature and trapezius.  Neurological exam: Strength is 5/5 bilaterally in both upper extremities.  Reflexes are equal at the biceps, triceps, and brachioradialis tendons bilaterally.  Sensation is intact to light touch grossly.  No atrophy.  Good pulses.       Assessment & Plan:    Right-sided neck and arm pain likely secondary to cervical radiculopathy  6-day Sterapred Dosepak to take as directed.  Gabapentin 300 mg nightly as needed.  Physical therapy at [facility] outpatient PT where I would like them to try a trial of traction in addition to modalities and exercises.  Phone follow-up with me in 1 week.  If symptoms persist, consider imaging in the form of x-rays and MRI to rule out a herniated cervical disc.  Patient is in agreement with this plan.

## 2018-03-04 ENCOUNTER — Encounter: Payer: Self-pay | Admitting: Physical Therapy

## 2018-03-04 ENCOUNTER — Ambulatory Visit: Payer: BLUE CROSS/BLUE SHIELD | Attending: Sports Medicine | Admitting: Physical Therapy

## 2018-03-04 ENCOUNTER — Other Ambulatory Visit: Payer: Self-pay

## 2018-03-04 DIAGNOSIS — M5412 Radiculopathy, cervical region: Secondary | ICD-10-CM | POA: Diagnosis present

## 2018-03-04 NOTE — Therapy (Signed)
[facility] [address] [place], [place], [address] Phone: [phone]   Fax:  [phone]  Physical Therapy Evaluation  Patient Details  Name: Mathew Davila MRN: [id number] Date of Birth: 1970/07/30 Referring Provider (PT): Reino Bellis, DO   Encounter Date: 03/04/2018  PT End of Session - 03/04/18 2010    Visit Number  1    Number of Visits  8    Date for PT Re-Evaluation  04/01/18    Authorization Type  [employer]    PT Start Time  1503    PT Stop Time  1543    PT Time Calculation (min)  40 min    Activity Tolerance  --   Fair tx. tolerance with discomfort in supine during traction   Behavior During Therapy  [facility] for tasks assessed/performed       Past Medical History:  Diagnosis Date  . Back pain     Past Surgical History:  Procedure Laterality Date  . KNEE SURGERY Right    right meniscus    There were no vitals filed for this visit.   Subjective Assessment - 03/04/18 1547    Subjective  Pt. had onset right sided cervical and right UE radiating pain and parasthesias about 2 1/2 weeks ago. No mechanism of injury-woke up with pain. Pt. saw Dr. Margaretha Sheffield with current PT referral to include trial traction-plan is to try PT and if symptoms persist will get MRI. Pt. has past history left UE radicular symptoms in 2004 resolved at the time with MRI findings of disc bulge.    Limitations  House hold activities;Sitting   sleep disturbance   How long can you sit comfortably?  unable comfortably    How long can you stand comfortably?  no limitations    How long can you walk comfortably?  no limitations    Diagnostic tests  past MRI 2004    Currently in Pain?  Yes    Pain Score  4     Pain Location  Neck    Pain Orientation  Right    Pain Descriptors / Indicators  Aching    Pain Type  Acute pain    Pain Radiating Towards  Right upper trapezius and right arm distally to hand in radial nerve distribution    Pain Onset  1  to 4 weeks ago    Pain Frequency  Constant    Aggravating Factors   supine positioning otherwise no aggs noted    Pain Relieving Factors  medication    Effect of Pain on Daily Activities  sleep disturbance and decreased tolerance ADLs/IADLs         [facility] PT Assessment - 03/04/18 0001      Assessment   Medical Diagnosis  Right sided neck pain/suspected cervical radiculopathy    Referring Provider (PT)  Reino Bellis, DO    Onset Date/Surgical Date  02/17/18    Hand Dominance  Right    Prior Therapy  none      Precautions   Precautions  None      Restrictions   Weight Bearing Restrictions  No      Balance Screen   Has the patient fallen in the past 6 months  No      Home Environment   Living Environment  Private residence    Living Arrangements  Spouse/significant other      Prior Function   Level of Independence  Independent with basic ADLs  Cognition   Overall Cognitive Status  Within Functional Limits for tasks assessed      Observation/Other Assessments   Focus on Therapeutic Outcomes (FOTO)   --   FOTO not set up yet at time of eval     Sensation   Light Touch  Appears Intact      Posture/Postural Control   Posture/Postural Control  --   Mild forward head     ROM / Strength   AROM / PROM / Strength  AROM;Strength      AROM   AROM Assessment Site  Shoulder;Cervical    Right/Left Shoulder  --   Bilat. shoulders grossly [facility]   Cervical Flexion  33    Cervical Extension  42   Increased cervical and right UE pain   Cervical - Right Side Bend  30    Cervical - Left Side Bend  30    Cervical - Right Rotation  70    Cervical - Left Rotation  75      Strength   Overall Strength  --   Bilat. UE grossly 5/5   Overall Strength Comments  Right grip 130 lbs., Left grip 100 lbs.      Palpation   Palpation comment  No significant tenderness to palpation noted      Special Tests    Special Tests  Cervical    Cervical Tests  Spurling's;Dictraction       Spurling's   Findings  Positive    Side  Right    Comment  also had mildly (+) ULTT for radial and median nerve on right      Distraction Test   Findngs  --   no clear change in symptoms               Objective measurements completed on examination: See above findings.      [facility] Adult PT Treatment/Exercise - 03/04/18 0001      Exercises   Exercises  Neck      Neck Exercises: Seated   Neck Retraction  15 reps      Modalities   Modalities  Traction   pt. declined further modalities post-tx.     Traction   Type of Traction  Cervical    Max (lbs)  15 lbs.    Time  10 min   static     Neck Exercises: Stretches   Upper Trapezius Stretch  --   instructed HEP                 PT Long Term Goals - 03/04/18 2017      PT LONG TERM GOAL #1   Title  Independent with HEP    Baseline  no HEP    Time  4    Period  Weeks    Status  New    Target Date  04/01/18      PT LONG TERM GOAL #2   Title  Centralize right UE symptoms to cervical region for improve tolerance ADLs with decreased arm pain    Baseline  symptoms distally to hand      PT LONG TERM GOAL #3   Title  Decrease sleep disturbance 75% or greater from current status    Baseline  significant sleep disturbance    Time  4    Period  Weeks    Status  New    Target Date  04/01/18      PT LONG TERM GOAL #4   Title  Tolerate sitting for car travel, eating meals periods 30 min or greater with pain decreased 75% or greater from current status    Time  4    Period  Weeks    Target Date  04/01/18             Plan - 03/04/18 2011    Clinical Impression Statement  Pt. presents with cervical/right upper extremity radiating pain-symptoms and exam findings consistent with radiculopathy. Pt. did have some upper trapezius region pain but no symptoms reproduced with palpation so pain this region is radicular particularly combined with more distal symptoms. Pt. would benefit from PT to address  current associated functional limitations.    History and Personal Factors relevant to plan of care:  previous history radicular symptoms 2004    Clinical Presentation  Stable    Clinical Decision Making  Low    Rehab Potential  Fair    Clinical Impairments Affecting Rehab Potential  Potential etiology symptoms, past history radicular symptoms    PT Frequency  2x / week    PT Duration  4 weeks    PT Treatment/Interventions  ADLs/Self Care Home Management;Cryotherapy;Electrical Stimulation;Ultrasound;Traction;Moist Heat;Therapeutic activities;Therapeutic exercise;Patient/family education;Neuromuscular re-education;Dry needling;Manual techniques;Taping    PT Next Visit Plan  Check response to trial traction and pending tolerance increase to 20 lbs., check response to retractions-potential addition gentle overpressure for centralization response, stretches, STM, modalities as needed    PT Home Exercise Plan  Cervical retractions and right upper trapezius stretch    Consulted and Agree with Plan of Care  Patient       Patient will benefit from skilled therapeutic intervention in order to improve the following deficits and impairments:  Decreased range of motion, Postural dysfunction, Pain, Decreased activity tolerance  Visit Diagnosis: Radiculopathy, cervical region     Problem List Patient Active Problem List   Diagnosis Date Noted  . Sprain of right acromioclavicular joint 01/26/2014  . Acute medial meniscus tear of right knee 08/30/2013  . Blood pressure elevated without history of HTN 10/13/2012  . Medial knee pain 09/14/2012  . Right hamstring muscle strain 09/14/2012    Lazarus Gowda, PT, DPT 03/04/18 8:24 PM  [facility] [address] [place], [place], [address] Phone: [phone]   Fax:  [phone]  Name: Mathew Davila MRN: [id number] Date of Birth: 12-09-1970

## 2018-03-10 ENCOUNTER — Telehealth: Payer: Self-pay

## 2018-03-10 DIAGNOSIS — M542 Cervicalgia: Secondary | ICD-10-CM

## 2018-03-10 NOTE — Telephone Encounter (Signed)
Pt states he has a high deductible insurance plan and wanted to know if he absolutely needed x-rays prior to getting the MRI. I told him I would try to get the approval without having had an x-ray and see what happens. Pt will wait to hear from me to schedule his MRI.

## 2018-03-12 ENCOUNTER — Encounter

## 2018-03-14 ENCOUNTER — Ambulatory Visit
Admission: RE | Admit: 2018-03-14 | Discharge: 2018-03-14 | Disposition: A | Discharge status: Home / Self Care | Payer: BLUE CROSS/BLUE SHIELD | Source: Ambulatory Visit | Attending: Sports Medicine | Admitting: Sports Medicine

## 2018-03-14 DIAGNOSIS — M542 Cervicalgia: Secondary | ICD-10-CM

## 2018-03-16 ENCOUNTER — Telehealth: Payer: Self-pay | Admitting: Sports Medicine

## 2018-03-16 DIAGNOSIS — M5010 Cervical disc disorder with radiculopathy, unspecified cervical region: Secondary | ICD-10-CM

## 2018-03-16 NOTE — Addendum Note (Signed)
Addended by: Rutha BouchardBABNIK, Raelea Gosse E on: 03/16/2018 05:27 PM   Modules accepted: Orders

## 2018-03-16 NOTE — Telephone Encounter (Signed)
  I spoke with Mathew Davila on the phone today after reviewing the MRI of his cervical spine.  He has moderate spinal stenosis at C5-C6 with a right subarticular disc protrusion.  I believe this is the source of his discomfort.  We will proceed with a cervical ESI and Mathew Davila will resume physical therapy.  Phone follow-up with me 1 week after his injection for check on his progress.

## 2018-03-17 ENCOUNTER — Other Ambulatory Visit: Payer: Self-pay | Admitting: Sports Medicine

## 2018-03-17 DIAGNOSIS — M5010 Cervical disc disorder with radiculopathy, unspecified cervical region: Secondary | ICD-10-CM

## 2018-03-29 ENCOUNTER — Ambulatory Visit
Admission: RE | Admit: 2018-03-29 | Discharge: 2018-03-29 | Disposition: A | Discharge status: Home / Self Care | Payer: BLUE CROSS/BLUE SHIELD | Source: Ambulatory Visit | Attending: Sports Medicine | Admitting: Sports Medicine

## 2018-03-29 DIAGNOSIS — M5010 Cervical disc disorder with radiculopathy, unspecified cervical region: Secondary | ICD-10-CM

## 2018-03-29 MED ORDER — TRIAMCINOLONE ACETONIDE 40 MG/ML IJ SUSP (RADIOLOGY)
60.0000 mg | Freq: Once | INTRAMUSCULAR | Status: AC
  Administered 2018-03-29: 60 mg via EPIDURAL

## 2018-03-29 MED ORDER — IOPAMIDOL (ISOVUE-M 300) INJECTION 61%
1.0000 mL | Freq: Once | INTRAMUSCULAR | Status: AC | PRN
  Administered 2018-03-29: 1 mL via EPIDURAL

## 2018-03-29 NOTE — Discharge Instructions (Signed)

## 2019-04-20 IMAGING — XA DG INJECT/[PERSON_NAME] INC NEEDLE/CATH/PLC EPI/CERV/THOR W/IMG
2 series · 2 of 2 positions shown · non-contrast
Comparison: none

CLINICAL DATA: Neck and RIGHT shoulder pain. Congenital spinal
stenosis with superimposed disc herniation C5-6 RIGHT and C6-7
central.

[Series 1: ortho standard · 1 of 1 slices shown (1 of 2)]
[im 1/1]
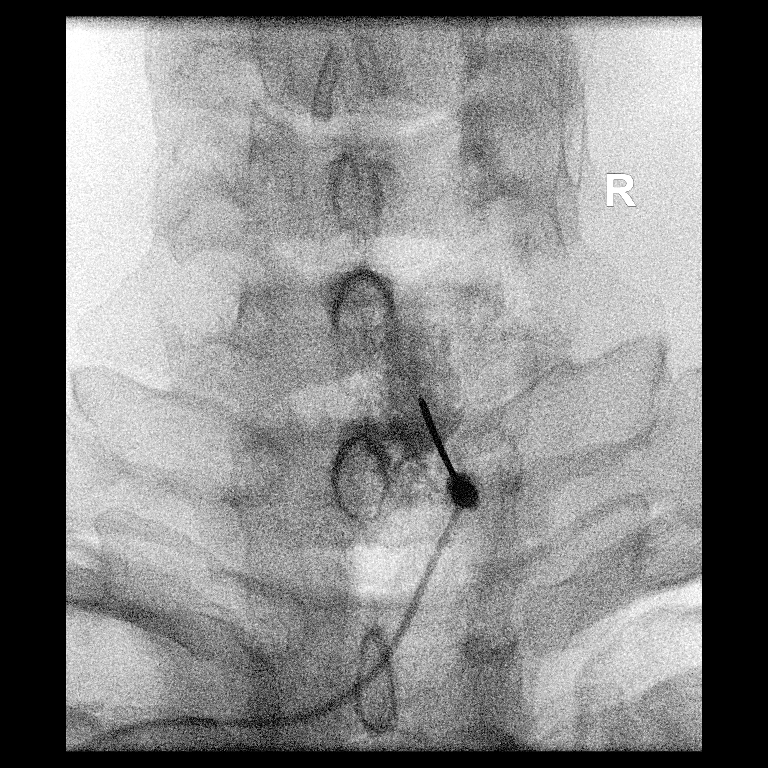

[Series 2: ortho standard · 1 of 1 slices shown (2 of 2)]
[im 1/1]
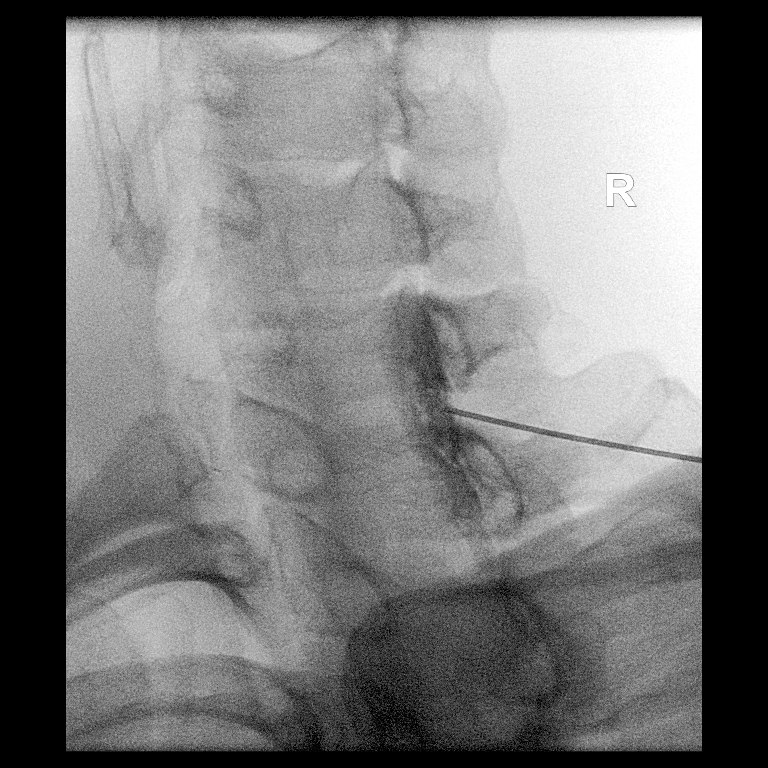

[2 of 2 positions shown; findings below may reference images not displayed]

FLUOROSCOPY TIME:  16 seconds corresponding to a Dose Area Product
of 6.67 Gy*m2

PROCEDURE:
Informed written consent was obtained.  Time-out was performed.

An appropriate skin entry site was chosen, cleansed with Betadine,
and anesthetized with 1% lidocaine.

CERVICAL EPIDURAL INJECTION

An interlaminar approach was performed on the RIGHT at C7-T1. A 20
gauge epidural needle was advanced using loss-of-resistance
technique.

DIAGNOSTIC EPIDURAL INJECTION

Injection of Isovue-M 300 shows a good epidural pattern with spread
above and below the level of needle placement, primarily on the
RIGHT. No vascular opacification is seen.

THERAPEUTIC EPIDURAL INJECTION

1.5 ml of Kenalog 40 mixed with 1 ml of 1% Lidocaine and 2 ml of
normal saline were then instilled. The procedure was well-tolerated,
and the patient was discharged thirty minutes following the
injection in good condition.
IMPRESSION: Technically successful first epidural injection on the RIGHT at
C7-T1.

## 2019-04-21 ENCOUNTER — Ambulatory Visit: Payer: BC Managed Care – PPO | Attending: Internal Medicine

## 2019-04-21 DIAGNOSIS — Z20822 Contact with and (suspected) exposure to covid-19: Secondary | ICD-10-CM

## 2019-04-22 LAB — NOVEL CORONAVIRUS, NAA: SARS-CoV-2, NAA: NOT DETECTED

## 2019-10-02 ENCOUNTER — Emergency Department (HOSPITAL_COMMUNITY): Payer: Managed Care, Other (non HMO)

## 2019-10-02 ENCOUNTER — Other Ambulatory Visit: Payer: Self-pay

## 2019-10-02 ENCOUNTER — Telehealth: Payer: Self-pay | Admitting: Physician Assistant

## 2019-10-02 ENCOUNTER — Emergency Department (HOSPITAL_COMMUNITY)
Admission: EM | Admit: 2019-10-02 | Discharge: 2019-10-02 | Disposition: A | Discharge status: Home / Self Care | Payer: Managed Care, Other (non HMO) | Attending: Emergency Medicine | Admitting: Emergency Medicine

## 2019-10-02 ENCOUNTER — Encounter (HOSPITAL_COMMUNITY): Payer: Self-pay | Admitting: Emergency Medicine

## 2019-10-02 DIAGNOSIS — R0789 Other chest pain: Secondary | ICD-10-CM | POA: Diagnosis present

## 2019-10-02 DIAGNOSIS — I480 Paroxysmal atrial fibrillation: Secondary | ICD-10-CM | POA: Insufficient documentation

## 2019-10-02 LAB — BASIC METABOLIC PANEL
Anion gap: 12 (ref 5–15)
BUN: 11 mg/dL (ref 6–20)
CO2: 25 mmol/L (ref 22–32)
Calcium: 9.1 mg/dL (ref 8.9–10.3)
Chloride: 100 mmol/L (ref 98–111)
Creatinine, Ser: 1.27 mg/dL — ABNORMAL HIGH (ref 0.61–1.24)
GFR calc Af Amer: >60 mL/min (ref >60)
GFR calc non Af Amer: >60 mL/min (ref >60)
Glucose, Bld: 123 mg/dL — ABNORMAL HIGH (ref 70–99)
Potassium: 3.7 mmol/L (ref 3.5–5.1)
Sodium: 137 mmol/L (ref 135–145)

## 2019-10-02 LAB — CBC
HCT: 48.9 % (ref 39.0–52.0)
Hemoglobin: 16.8 g/dL (ref 13.0–17.0)
MCH: 31.9 pg (ref 26.0–34.0)
MCHC: 34.4 g/dL (ref 30.0–36.0)
MCV: 92.8 fL (ref 80.0–100.0)
Platelets: 171 10*3/uL (ref 150–400)
RBC: 5.27 MIL/uL (ref 4.22–5.81)
RDW: 12.4 % (ref 11.5–15.5)
WBC: 6.9 10*3/uL (ref 4.0–10.5)
nRBC: 0 % (ref 0.0–0.2)

## 2019-10-02 LAB — TROPONIN I (HIGH SENSITIVITY)
Troponin I (High Sensitivity): 14 ng/L (ref <18)
Troponin I (High Sensitivity): 8 ng/L (ref <18)

## 2019-10-02 MED ORDER — SODIUM CHLORIDE 0.9% FLUSH: 3.0000 mL | Freq: Once | INTRAVENOUS | Status: DC

## 2019-10-02 MED ORDER — SODIUM CHLORIDE 0.9 % IV BOLUS: 1000.0000 mL | Freq: Once | INTRAVENOUS | Status: DC

## 2019-10-02 NOTE — Telephone Encounter (Signed)
   The patient and wife called the answering service after-hours today. He has an Apple watch and was notified overnight that his HR was over 100 and in atrial fib or possible bigeminy. He is feeling very fatigued and did not sleep well. HR usually closer to the 50s. We discussed potential options for evaluation to include urgent care or ER. However, he has no recent labwork in Epic and not sure urgent care would have the capability for stat labs in case this represents either of these, requiring decision making about treatment (I.e. replacement of lytes or initiation of anticoagulation).  He is not on any anticoagulation. It would be most helpful to try and capture EKG while arrhythmia is happening so advised he be seen today. The patient verbalized understanding and gratitude.  Laurann Montana, PA-C

## 2019-10-02 NOTE — ED Notes (Signed)
PT declined IV-fluid He states he feels better, prefers to drink water  EDP notified PT is given water and second Trop drawn.

## 2019-10-02 NOTE — ED Triage Notes (Signed)
Patient arrives to ED with complaints of left sided chest pain since last night. Patient states last night he woke up to his Apple Watch telling him he was in Afib, patient states he got SOB and felt his heart racing. Patient state she is CP free on arrival.

## 2019-10-02 NOTE — Discharge Instructions (Signed)
As discussed, your evaluation today has been largely reassuring.  But, it is important that you monitor your condition carefully, and do not hesitate to return to the ED if you develop new, or concerning changes in your condition. ? ?Otherwise, please follow-up with your physician for appropriate ongoing care. ? ?

## 2019-10-02 NOTE — ED Notes (Signed)
Pt verbalized understanding od d/c instructions, follow up care and s/s requiring return to ed. Pt had no further questions and refused wheelchair. Pt escorted to exit by nurse.

## 2019-10-02 NOTE — ED Provider Notes (Signed)
[facility] EMERGENCY DEPARTMENT Provider Note   CSN: [id number] Arrival date & time: 10/02/19  1524     History Chief Complaint  Patient presents with  . Chest Pain    Mathew Davila is a 49 y.o. male.  HPI     Patient presents with concern of left-sided chest pain, mild.  This occurs in the context of recent evidence for new A. fib. 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 Patient notes he is generally well, but last night, over the course of the evening and night he had restlessness, and his smart watch demonstrated evidence for atrial fibrillation.  That seemingly resolved earlier in the day, but now he presents with left-sided discomfort. He has had several other small minor medical issues including left knee sprain, left forearm tightness, but is otherwise been generally well. Patient does not smoke, drinks socially. With concern for A. fib, chest pain, he presents for evaluation. He does note history of prior evaluation, possibly after that infection episode, with stress test, echocardiogram.  Past Medical History:  Diagnosis Date  . Back pain     Patient Active Problem List   Diagnosis Date Noted  . Sprain of right acromioclavicular joint 01/26/2014  . Acute medial meniscus tear of right knee 08/30/2013  . Blood pressure elevated without history of HTN 10/13/2012  . Medial knee pain 09/14/2012  . Right hamstring muscle strain 09/14/2012    Past Surgical History:  Procedure Laterality Date  . KNEE SURGERY Right    right meniscus       Family History  Problem Relation Age of Onset  . Hypertension Father   . Heart attack Paternal Grandfather 37    Social History   Tobacco Use  . Smoking status: Never Smoker  . Smokeless tobacco: Never Used  Substance Use Topics  . Alcohol use: Yes  . Drug use: No    Home Medications Prior to Admission medications     Medication Sig Start Date End Date Taking? Authorizing Provider  finasteride (PROPECIA) 1 MG tablet Take 1 mg by mouth.    [provider]  gabapentin (NEURONTIN) 300 MG capsule Take 1 capsule (300 mg total) by mouth at bedtime as needed. 03/02/18   Thurman Coyer, DO  ibuprofen (ADVIL,MOTRIN) 200 MG tablet Take 200 mg by mouth every 6 (six) hours as needed.    [provider]  predniSONE (DELTASONE) 10 MG tablet Use as directed per doctors orders. 03/02/18   Thurman Coyer, DO    Allergies    Patient has no known allergies.  Review of Systems   Review of Systems  Constitutional:       Per HPI, otherwise negative  HENT:       Per HPI, otherwise negative  Respiratory:       Per HPI, otherwise negative  Cardiovascular:       Per HPI, otherwise negative  Gastrointestinal: Negative for vomiting.  Endocrine:       Negative aside from HPI  Genitourinary:       Neg aside from HPI   Musculoskeletal:       Per HPI, otherwise negative  Skin: Negative.   Neurological: Negative for syncope.    Physical Exam Updated Vital Signs BP (!) 147/89   Pulse 60   Temp 98.2 F (36.8 C) (Oral)   Resp (!) 34   Ht 5\' 11"  (1.803 m)  Wt 87.5 kg   SpO2 96%   BMI 26.92 kg/m   Physical Exam Vitals and nursing note reviewed.  Constitutional:      General: He is not in acute distress.    Appearance: He is well-developed.  HENT:     Head: Normocephalic and atraumatic.  Eyes:     Conjunctiva/sclera: Conjunctivae normal.  Cardiovascular:     Rate and Rhythm: Normal rate and regular rhythm.  Pulmonary:     Effort: Pulmonary effort is normal. No respiratory distress.     Breath sounds: No stridor.  Abdominal:     General: There is no distension.  Skin:    General: Skin is warm and dry.  Neurological:     Mental Status: He is alert and oriented to person, place, and time.     ED Results / Procedures / Treatments   Labs (all labs ordered are listed, but only  abnormal results are displayed) Labs Reviewed  BASIC METABOLIC PANEL - Abnormal; Notable for the following components:      Result Value   Glucose, Bld 123 (*)    Creatinine, Ser 1.27 (*)    All other components within normal limits  CBC  TROPONIN I (HIGH SENSITIVITY)  TROPONIN I (HIGH SENSITIVITY)    EKG EKG Interpretation  Date/Time:  Sunday October 02 2019 15:23:36 EDT Ventricular Rate:  84 PR Interval:  146 QRS Duration: 92 QT Interval:  384 QTC Calculation: 453 R Axis:   -53 Text Interpretation: Normal sinus rhythm Right atrial enlargement Left axis deviation Pulmonary disease pattern Minimal voltage criteria for LVH, may be normal variant ( Cornell product ) T wave abnormality Abnormal ECG Confirmed by Gerhard Munch [phone]) on 10/02/2019 4:20:25 PM   Radiology DG Chest 2 View  Result Date: 10/02/2019 CLINICAL DATA:  Patient with left-sided chest pain. EXAM: CHEST - 2 VIEW COMPARISON:  Chest radiograph 06/08/2003 FINDINGS: Normal cardiac and mediastinal contours. No consolidative pulmonary opacities. No pleural effusion or pneumothorax. Osseous structures unremarkable. IMPRESSION: No acute cardiopulmonary process. Electronically Signed   By: Annia Belt M.D.   On: 10/02/2019 16:28    Procedures Procedures (including critical care time)  Medications Ordered in ED Medications  sodium chloride flush (NS) 0.9 % injection 3 mL (has no administration in time range)  sodium chloride 0.9 % bolus 1,000 mL (1,000 mLs Intravenous Refused 10/02/19 1916)    ED Course  I have reviewed the triage vital signs and the nursing notes.  Pertinent labs & imaging results that were available during my care of the patient were reviewed by me and considered in my medical decision making (see chart for details).   After initial evaluation reviewed the patient's apple watch information on his phone.  There are several tracings all consistent with atrial fibrillation.  Heart rate was in the 80/100  range.   CHA2DS2-VASc Score = 0  The patient's score is based upon:       ASSESSMENT AND PLAN: Paroxysmal Atrial Fibrillation (ICD10:  I48.0) The patient's CHA2DS2-VASc score is 0, indicating a 0.2% annual risk of stroke.       Signed,  Gerhard Munch, MD    10/02/2019 7:20 PM    MDM Rules/Calculators/A&P                      7:19 PM Patient is awake, alert, speaking clearly.  We again discussed all findings and patient remains in a sinus rhythm. Findings notable for 2 normal troponin, nonischemic EKG, sinus  rhythm, low suspicion for ACS, no evidence for substantial electrolyte abnormalities.  The patient does have slightly elevated creatinine concerning for mild dehydration, which may be contributing to the prior episode of A. fib. Patient has been offered IV fluid resuscitation, but prefers oral resuscitation, which is reasonable given his tolerance of this. Now, with no ongoing complaints, no ongoing chest pain, no evidence for ACS, the patient is appropriate for discharge to follow-up with our A. fib clinic. Patient will discuss his hypertension, A. fib, and consideration of anticoagulants with those individuals in the coming days. Patient amenable to this plan. Return precautions, also discussed at length.  MDM Number of Diagnoses or Management Options Paroxysmal atrial fibrillation (HCC): new, needed workup   Amount and/or Complexity of Data Reviewed Clinical lab tests: reviewed Tests in the radiology section of CPT: reviewed Tests in the medicine section of CPT: reviewed Decide to obtain previous medical records or to obtain history from someone other than the patient: yes Review and summarize past medical records: yes Independent visualization of images, tracings, or specimens: yes  Risk of Complications, Morbidity, and/or Mortality Presenting problems: high Diagnostic procedures: high Management options: high   Final Clinical Impression(s) / ED  Diagnoses Final diagnoses:  Paroxysmal atrial fibrillation (HCC)      Gerhard Munch, MD 10/02/19 [phone]

## 2019-10-04 ENCOUNTER — Encounter (HOSPITAL_COMMUNITY): Payer: Self-pay | Admitting: Nurse Practitioner

## 2019-10-04 ENCOUNTER — Other Ambulatory Visit: Payer: Self-pay

## 2019-10-04 ENCOUNTER — Ambulatory Visit (HOSPITAL_COMMUNITY)
Admission: RE | Admit: 2019-10-04 | Discharge: 2019-10-04 | Disposition: A | Discharge status: Home / Self Care | Payer: Managed Care, Other (non HMO) | Source: Ambulatory Visit | Attending: Nurse Practitioner | Admitting: Nurse Practitioner

## 2019-10-04 VITALS — BP 166/88 | Ht 71.0 in | Wt 190.0 lb

## 2019-10-04 DIAGNOSIS — I10 Essential (primary) hypertension: Secondary | ICD-10-CM | POA: Insufficient documentation

## 2019-10-04 DIAGNOSIS — I480 Paroxysmal atrial fibrillation: Secondary | ICD-10-CM | POA: Insufficient documentation

## 2019-10-04 DIAGNOSIS — Z79899 Other long term (current) drug therapy: Secondary | ICD-10-CM | POA: Diagnosis not present

## 2019-10-04 DIAGNOSIS — Z791 Long term (current) use of non-steroidal anti-inflammatories (NSAID): Secondary | ICD-10-CM | POA: Insufficient documentation

## 2019-10-04 MED ORDER — DILTIAZEM HCL 30 MG PO TABS: ORAL_TABLET | ORAL | 1 refills | Status: AC

## 2019-10-04 NOTE — Progress Notes (Addendum)
Primary Care Physician: Blair Heys, MD Referring Physician: [facility] ER f/u   Mathew Davila is a 49 y.o. male with a h/o with no significant medical history  other than remote pericarditis more than 15 years ago. He is in the afib clinic for f/u from the ER. He had palpitations and presented to the ER with chest pain after his apple watch had shown afib for 10 hours with v rates around 100 bpm.(strips reviewed and agree that afib was present).this was first occurrence of afib.  He had SR in the ER. He has a CHA2DS2VASc score of 0-1 as his BP is elevated but he says that he has never been dx with HTN in the past. He states that he had a steroid shot in the knee last Wednesday and has been on celebrex since then. No known injury to knee, it just started hurting. He does ride a bike outside for exercsie and lifts weights. He may drink  3-4 alcoholic drinks 2-3 nights during the week, more so on the weekend. He does admit to snoring and waking up gasping for air. Has never had a sleep study. He had a stress echo in 2019 following a visit from  the ER for chest pain which was normal.  Today, he denies symptoms of palpitations, chest pain, shortness of breath, orthopnea, PND, lower extremity edema, dizziness, presyncope, syncope, or neurologic sequela. The patient is tolerating medications without difficulties and is otherwise without complaint today.   Past Medical History:  Diagnosis Date  . Back pain    Past Surgical History:  Procedure Laterality Date  . KNEE SURGERY Right    right meniscus    Current Outpatient Medications  Medication Sig Dispense Refill  . anastrozole (ARIMIDEX) 1 MG tablet Take 1 mg by mouth once a week.     . CELEBREX 200 MG capsule Take 200 mg by mouth as needed.     . clomiPHENE (CLOMID) 50 MG tablet Take 25 mg by mouth 3 (three) times a week.    . finasteride (PROPECIA) 1 MG tablet Take 1 mg by mouth daily.     Marland Kitchen ibuprofen (ADVIL,MOTRIN) 200 MG tablet Take  200 mg by mouth as needed.      No current facility-administered medications for this encounter.    No Known Allergies  Social History   Socioeconomic History  . Marital status: Married    Spouse name: Not on file  . Number of children: Not on file  . Years of education: Not on file  . Highest education level: Not on file  Occupational History  . Not on file  Tobacco Use  . Smoking status: Never Smoker  . Smokeless tobacco: Never Used  Substance and Sexual Activity  . Alcohol use: Yes  . Drug use: No  . Sexual activity: Not on file  Other Topics Concern  . Not on file  Social History Narrative  . Not on file   Social Determinants of Health   Financial Resource Strain:   . Difficulty of Paying Living Expenses:   Food Insecurity:   . Worried About Programme researcher, broadcasting/film/video in the Last Year:   . Barista in the Last Year:   Transportation Needs:   . Freight forwarder (Medical):   Marland Kitchen Lack of Transportation (Non-Medical):   Physical Activity:   . Days of Exercise per Week:   . Minutes of Exercise per Session:   Stress:   . Feeling of Stress :  Social Connections:   . Frequency of Communication with Friends and Family:   . Frequency of Social Gatherings with Friends and Family:   . Attends Religious Services:   . Active Member of Clubs or Organizations:   . Attends Archivist Meetings:   Marland Kitchen Marital Status:   Intimate Partner Violence:   . Fear of Current or Ex-Partner:   . Emotionally Abused:   Marland Kitchen Physically Abused:   . Sexually Abused:     Family History  Problem Relation Age of Onset  . Hypertension Father   . Heart attack Paternal Grandfather 61    ROS- All systems are reviewed and negative except as per the HPI above  Physical Exam: Vitals:   10/04/19 0833  BP: (!) 166/88  Weight: 86.2 kg  Height: 5\' 11"  (1.803 m)   Wt Readings from Last 3 Encounters:  10/04/19 86.2 kg  10/02/19 87.5 kg  03/02/18 79.8 kg    Labs: Lab Results    Component Value Date   NA 137 10/02/2019   K 3.7 10/02/2019   CL 100 10/02/2019   CO2 25 10/02/2019   GLUCOSE 123 (H) 10/02/2019   BUN 11 10/02/2019   CREATININE 1.27 (H) 10/02/2019   CALCIUM 9.1 10/02/2019   No results found for: INR No results found for: CHOL, HDL, LDLCALC, TRIG   GEN- The patient is well appearing, alert and oriented x 3 today.   Head- normocephalic, atraumatic Eyes-  Sclera clear, conjunctiva pink Ears- hearing intact Oropharynx- clear Neck- supple, no JVP Lymph- no cervical lymphadenopathy Lungs- Clear to ausculation bilaterally, normal work of breathing Heart- Regular rate and rhythm, no murmurs, rubs or gallops, PMI not laterally displaced GI- soft, NT, ND, + BS Extremities- no clubbing, cyanosis, or edema MS- no significant deformity or atrophy Skin- no rash or lesion Psych- euthymic mood, full affect Neuro- strength and sensation are intact  EKG-sinus brady at 52 bpm with LVH pr int 148 ms, qrs int 92 ms, qtc 450 ms    Assessment and Plan: 1. New onset  afib General education re afib Triggers discussed  Recommend limiting alcohol to 2 drinks a week Limit caffeine to no more thatn 2 drinks a day Sleep study ordered for snoring, gasping for air at night  Will order echo  Will also rx 30 mg Cardizem to use as instructed for afib episodes   2. Elevated blood pressure Pt is to buy a BP cuff and watch readings at home Recent celebrexand steroid injections may be making BP more elevated than usual  Encouraged  to make appointment with PCP whom he has not seen in 2-3 years   Will call results of echo and determine f/u at that time   Butch Penny C. Hilliary Jock, [facility] [address] [place], [place] [address] [phone]

## 2019-10-04 NOTE — Patient Instructions (Signed)
Cardizem 30mg  -- take 1 tablet every 4 hours AS NEEDED for AFIB heart rate >100 as long as top number of blood pressure >100.    Scheduling will call regarding sleep study after insurance approval.

## 2019-10-07 ENCOUNTER — Ambulatory Visit (HOSPITAL_COMMUNITY)
Admission: RE | Admit: 2019-10-07 | Discharge: 2019-10-07 | Disposition: A | Discharge status: Home / Self Care | Payer: Managed Care, Other (non HMO) | Source: Ambulatory Visit | Attending: Family Medicine | Admitting: Family Medicine

## 2019-10-07 ENCOUNTER — Other Ambulatory Visit: Payer: Self-pay

## 2019-10-07 DIAGNOSIS — I517 Cardiomegaly: Secondary | ICD-10-CM | POA: Insufficient documentation

## 2019-10-07 DIAGNOSIS — I480 Paroxysmal atrial fibrillation: Secondary | ICD-10-CM | POA: Diagnosis not present

## 2019-10-07 NOTE — Progress Notes (Signed)
*  PRELIMINARY RESULTS* 2D Echocardiogram has been performed.  Mathew Davila 10/07/2019, 9:33 AM

## 2019-10-10 ENCOUNTER — Telehealth (HOSPITAL_COMMUNITY): Payer: Self-pay | Admitting: *Deleted

## 2019-10-10 MED ORDER — LOSARTAN POTASSIUM 50 MG PO TABS
50.0000 mg | ORAL_TABLET | Freq: Every day | ORAL | 3 refills | Status: DC
Start: 2019-10-10 — End: 2020-02-06

## 2019-10-10 NOTE — Telephone Encounter (Signed)
Called echo results to patient - having continued elevated BP - 158-166/80-95. Discussed with Rudi Coco NP will start losartan 50mg  once a day with follow up BP/BMET in 1 week. Pt verbalized understanding.

## 2019-10-20 ENCOUNTER — Ambulatory Visit (HOSPITAL_COMMUNITY)
Admission: RE | Admit: 2019-10-20 | Discharge: 2019-10-20 | Disposition: A | Discharge status: Home / Self Care | Payer: Managed Care, Other (non HMO) | Source: Ambulatory Visit | Attending: Nurse Practitioner | Admitting: Nurse Practitioner

## 2019-10-20 ENCOUNTER — Other Ambulatory Visit: Payer: Self-pay

## 2019-10-20 DIAGNOSIS — Z0130 Encounter for examination of blood pressure without abnormal findings: Secondary | ICD-10-CM | POA: Diagnosis present

## 2019-10-20 DIAGNOSIS — Z7901 Long term (current) use of anticoagulants: Secondary | ICD-10-CM | POA: Insufficient documentation

## 2019-10-20 LAB — BASIC METABOLIC PANEL
Anion gap: 9 (ref 5–15)
BUN: 11 mg/dL (ref 6–20)
CO2: 27 mmol/L (ref 22–32)
Calcium: 8.7 mg/dL — ABNORMAL LOW (ref 8.9–10.3)
Chloride: 106 mmol/L (ref 98–111)
Creatinine, Ser: 1.03 mg/dL (ref 0.61–1.24)
GFR calc Af Amer: >60 mL/min (ref >60)
GFR calc non Af Amer: >60 mL/min (ref >60)
Glucose, Bld: 105 mg/dL — ABNORMAL HIGH (ref 70–99)
Potassium: 4.4 mmol/L (ref 3.5–5.1)
Sodium: 142 mmol/L (ref 135–145)

## 2019-10-20 NOTE — Progress Notes (Signed)
In office for  BP check on losartan 50 mg daily started last week. BP improved at 128/68, bmet pending

## 2019-10-25 NOTE — Progress Notes (Signed)
HPI: Follow-up atrial fibrillation.  Stress echocardiogram March 2019 normal.  Patient seen June 2021 after experiencing an episode of palpitations.  His apple watch apparently showed atrial fibrillation and the strips were reviewed by Rudi Coco and atrial fibrillation confirmed.  Patient converted spontaneously.  Echocardiogram June 2021 showed normal LV systolic function, grade 1 diastolic dysfunction, mild left ventricular hypertrophy, mild mitral regurgitation secondary to bileaflet mitral valve prolapse.  Patient was given Cardizem to take as needed.  Since last seen he has dyspnea with more vigorous activities but not routine activities.  No orthopnea, PND, pedal edema, chest pain or syncope.  No recurrent atrial fibrillation by his report.  Current Outpatient Medications  Medication Sig Dispense Refill  . anastrozole (ARIMIDEX) 1 MG tablet Take 1 mg by mouth once a week.     . CELEBREX 200 MG capsule Take 200 mg by mouth as needed.     . clomiPHENE (CLOMID) 50 MG tablet Take 25 mg by mouth 3 (three) times a week.    . diltiazem (CARDIZEM) 30 MG tablet Take 1 tablet every 4 hours AS NEEDED for afib heart rate >100 45 tablet 1  . finasteride (PROPECIA) 1 MG tablet Take 1 mg by mouth daily.     Marland Kitchen ibuprofen (ADVIL,MOTRIN) 200 MG tablet Take 200 mg by mouth as needed.     Marland Kitchen losartan (COZAAR) 50 MG tablet Take 1 tablet (50 mg total) by mouth daily. 30 tablet 3   No current facility-administered medications for this visit.     Past Medical History:  Diagnosis Date  . Back pain     Past Surgical History:  Procedure Laterality Date  . KNEE SURGERY Right    right meniscus    Social History   Socioeconomic History  . Marital status: Married    Spouse name: Not on file  . Number of children: Not on file  . Years of education: Not on file  . Highest education level: Not on file  Occupational History  . Not on file  Tobacco Use  . Smoking status: Never Smoker  .  Smokeless tobacco: Never Used  Substance and Sexual Activity  . Alcohol use: Yes  . Drug use: No  . Sexual activity: Not on file  Other Topics Concern  . Not on file  Social History Narrative  . Not on file   Social Determinants of Health   Financial Resource Strain:   . Difficulty of Paying Living Expenses:   Food Insecurity:   . Worried About Programme researcher, broadcasting/film/video in the Last Year:   . Barista in the Last Year:   Transportation Needs:   . Freight forwarder (Medical):   Marland Kitchen Lack of Transportation (Non-Medical):   Physical Activity:   . Days of Exercise per Week:   . Minutes of Exercise per Session:   Stress:   . Feeling of Stress :   Social Connections:   . Frequency of Communication with Friends and Family:   . Frequency of Social Gatherings with Friends and Family:   . Attends Religious Services:   . Active Member of Clubs or Organizations:   . Attends Banker Meetings:   Marland Kitchen Marital Status:   Intimate Partner Violence:   . Fear of Current or Ex-Partner:   . Emotionally Abused:   Marland Kitchen Physically Abused:   . Sexually Abused:     Family History  Problem Relation Age of Onset  . Hypertension Father   .  Heart attack Paternal Grandfather 64    ROS: no fevers or chills, productive cough, hemoptysis, dysphasia, odynophagia, melena, hematochezia, dysuria, hematuria, rash, seizure activity, orthopnea, PND, pedal edema, claudication. Remaining systems are negative.  Physical Exam: Well-developed well-nourished in no acute distress.  Skin is warm and dry.  HEENT is normal.  Neck is supple.  Chest is clear to auscultation with normal expansion.  Cardiovascular exam is regular rate and rhythm.  Abdominal exam nontender or distended. No masses palpated. Extremities show no edema. neuro grossly intact  A/P  1 paroxysmal atrial fibrillation-patient remains in sinus rhythm.  We will continue Cardizem as needed. Check TSH.  LV function normal.  Patient  does snore and we will arrange a sleep study to exclude sleep apnea as a contributor.  I have asked him to decrease his alcohol use. CHADSvasc 1.  He has had 1 isolated episode.  We will treat with aspirin 81 mg daily.  If he has more frequent episodes in the future I will likely instead treat with apixaban.  2 mitral valve prolapse with mild mitral regurgitation-patient will need follow-up echoes in the future.  3 hypertension-patient's blood pressure appears to be controlled.  We will continue present medications and adjust regimen as needed.  Olga Millers, MD

## 2019-11-02 ENCOUNTER — Encounter: Payer: Self-pay | Admitting: Cardiology

## 2019-11-02 ENCOUNTER — Ambulatory Visit (INDEPENDENT_AMBULATORY_CARE_PROVIDER_SITE_OTHER): Payer: Managed Care, Other (non HMO) | Admitting: Cardiology

## 2019-11-02 ENCOUNTER — Other Ambulatory Visit: Payer: Self-pay

## 2019-11-02 ENCOUNTER — Other Ambulatory Visit: Payer: Self-pay | Admitting: Nurse Practitioner

## 2019-11-02 ENCOUNTER — Telehealth: Payer: Self-pay | Admitting: *Deleted

## 2019-11-02 VITALS — BP 130/74 | HR 72 | Ht 71.0 in | Wt 188.0 lb

## 2019-11-02 DIAGNOSIS — I480 Paroxysmal atrial fibrillation: Secondary | ICD-10-CM | POA: Diagnosis not present

## 2019-11-02 DIAGNOSIS — I10 Essential (primary) hypertension: Secondary | ICD-10-CM

## 2019-11-02 DIAGNOSIS — I341 Nonrheumatic mitral (valve) prolapse: Secondary | ICD-10-CM

## 2019-11-02 DIAGNOSIS — R0683 Snoring: Secondary | ICD-10-CM

## 2019-11-02 MED ORDER — ASPIRIN EC 81 MG PO TBEC: 81.0000 mg | DELAYED_RELEASE_TABLET | Freq: Every day | ORAL | 3 refills | Status: DC

## 2019-11-02 NOTE — Telephone Encounter (Signed)
PA request for HST submitted to [facility] via fax. Patient does not have any comorbidities for in lab sleep study.

## 2019-11-02 NOTE — Patient Instructions (Signed)
Medication Instructions:   START ASPIRIN 81 MG ONCE DAILY  *If you need a refill on your cardiac medications before your next appointment, please call your pharmacy*   Lab Work:  Your physician recommends that you HAVE LAB WORK TODAY  If you have labs (blood work) drawn today and your tests are completely normal, you will receive your results only by: Marland Kitchen MyChart Message (if you have MyChart) OR . A paper copy in the mail If you have any lab test that is abnormal or we need to change your treatment, we will call you to review the results.   Testing/Procedures:  Your physician has recommended that you have a sleep study. This test records several body functions during sleep, including: brain activity, eye movement, oxygen and carbon dioxide blood levels, heart rate and rhythm, breathing rate and rhythm, the flow of air through your mouth and nose, snoring, body muscle movements, and chest and belly movement.[facility]    Follow-Up: At [facility], you and your health needs are our priority.  As part of our continuing mission to provide you with exceptional heart care, we have created designated Provider Care Teams.  These Care Teams include your primary Cardiologist (physician) and Advanced Practice Providers (APPs -  Physician Assistants and Nurse Practitioners) who all work together to provide you with the care you need, when you need it.  We recommend signing up for the patient portal called "MyChart".  Sign up information is provided on this After Visit Summary.  MyChart is used to connect with patients for Virtual Visits (Telemedicine).  Patients are able to view lab/test results, encounter notes, upcoming appointments, etc.  Non-urgent messages can be sent to your provider as well.   To learn more about what you can do with MyChart, go to [email or web address].    Your next appointment:   6 month(s)  The format for your next appointment:   In Person  Provider:    Olga Millers, MD

## 2019-11-03 ENCOUNTER — Telehealth: Payer: Self-pay | Admitting: *Deleted

## 2019-11-03 LAB — TSH: TSH: 2.15 u[IU]/mL (ref 0.450–4.500)

## 2019-11-03 NOTE — Telephone Encounter (Signed)
Patient notified Mathew Davila denied in lab sleep study. HST scheduled 12/30/19 @ 10:30. Ok per Rudi Coco.

## 2019-11-09 ENCOUNTER — Telehealth (HOSPITAL_COMMUNITY): Payer: Self-pay | Admitting: *Deleted

## 2019-11-09 NOTE — Telephone Encounter (Signed)
Patient called in with concern of going into AF during exercise today. Heart rates were higher than normal and his apple watch indicated AF. Heart took about 10 minutes to normalize. Pt worried he cannot exercise any more - per Rudi Coco NP pt can continue exercise. Monitor frequency/duration/situation of AF if increases and continued to be associated with exercise inform office. Pt does have 30mg  of cardizem to use PRN for breakthrough. Pt will call if issues arise.

## 2019-12-30 ENCOUNTER — Ambulatory Visit (HOSPITAL_BASED_OUTPATIENT_CLINIC_OR_DEPARTMENT_OTHER): Payer: Managed Care, Other (non HMO) | Attending: Nurse Practitioner | Admitting: Cardiovascular Disease

## 2019-12-30 ENCOUNTER — Other Ambulatory Visit: Payer: Self-pay

## 2019-12-30 DIAGNOSIS — I4891 Unspecified atrial fibrillation: Secondary | ICD-10-CM | POA: Insufficient documentation

## 2019-12-30 DIAGNOSIS — I480 Paroxysmal atrial fibrillation: Secondary | ICD-10-CM

## 2019-12-30 DIAGNOSIS — R0683 Snoring: Secondary | ICD-10-CM | POA: Diagnosis not present

## 2019-12-30 DIAGNOSIS — G4733 Obstructive sleep apnea (adult) (pediatric): Secondary | ICD-10-CM | POA: Diagnosis not present

## 2020-01-11 ENCOUNTER — Encounter (HOSPITAL_BASED_OUTPATIENT_CLINIC_OR_DEPARTMENT_OTHER): Payer: Self-pay | Admitting: Cardiovascular Disease

## 2020-01-11 NOTE — Procedures (Signed)
    Patient Name: Mathew Davila, Mathew Davila Date: 12/31/2019 Gender: Male D.O.B: 1971/03/17 Age (years): 9 Referring Provider: Newman Nip Height (inches): 71 Interpreting Physician: Nicki Guadalajara MD, ABSM Weight (lbs): 185 RPSGT: Elaina Pattee BMI: 26 MRN: [id number] Neck Size: 16.25  CLINICAL INFORMATION Sleep Study Type: HST  Indication for sleep study: N/A  Epworth Sleepiness Score: 8  SLEEP STUDY TECHNIQUE A multi-channel overnight portable sleep study was performed. The channels recorded were: nasal airflow, thoracic respiratory movement, and oxygen saturation with a pulse oximetry. Snoring was also monitored.  MEDICATIONS anastrozole (ARIMIDEX) 1 MG tablet aspirin EC 81 MG tablet CELEBREX 200 MG capsule clomiPHENE (CLOMID) 50 MG tablet diltiazem (CARDIZEM) 30 MG tablet finasteride (PROPECIA) 1 MG tablet ibuprofen (ADVIL,MOTRIN) 200 MG tablet losartan (COZAAR) 50 MG tablet(Expired) Patient self administered medications include: N/A.  SLEEP ARCHITECTURE Patient was studied for 379.4 minutes. The sleep efficiency was 99.9 % and the patient was supine for 50.8%. The arousal index was 0.0 per hour.  RESPIRATORY PARAMETERS The overall AHI was 18.8 per hour, with a central apnea index of 0.0 per hour.  The oxygen nadir was 87% during sleep.  CARDIAC DATA Mean heart rate during sleep was 43.6 bpm.  IMPRESSIONS - Moderate obstructive sleep apnea occurred during this study (AHI 18.8/h). There was a significant positional component with supine sleep AHI 33.3/h versus non-supine AHI 3.9/h.  The severity of sleep apnea during REM sleep cannot be assessed on this home study. - No significant central sleep apnea occurred during this study (CAI = 0.0/h). - Mild oxygen desaturation to a nadir of 87%. - Patient snored 7.1% during the sleep.  DIAGNOSIS - Obstructive Sleep Apnea (G47.33)  RECOMMENDATIONS - In this patient with cardiovascular co-morbidites including  PAF and hypertension recommend a CPAP titration study. If unable to schedule in-lab, then initiate Auto -PAP with EPR at 6 - 16 cm of water. - Effort should be made to optimize nasal and oropharyngeal patency. - Positional therapy avoiding supine position during sleep. - Avoid alcohol, sedatives and other CNS depressants that may worsen sleep apnea and disrupt normal sleep architecture. - Sleep hygiene should be reviewed to assess factors that may improve sleep quality. - Weight management and regular exercise should be initiated or continued. - Recommend a download and sleep clinic evaluation 4 weeks after CPAP initiation.   [Electronically signed] 01/11/2020 04:02 PM  Nicki Guadalajara MD, [facility], ABSM Diplomate, American Board of Sleep Medicine   NPI: [id number] [facility] PH: [phone]   FX: [phone] ACCREDITED BY THE AMERICAN ACADEMY OF SLEEP MEDICINE

## 2020-01-16 ENCOUNTER — Telehealth: Payer: Self-pay | Admitting: *Deleted

## 2020-01-16 NOTE — Telephone Encounter (Signed)
Patient notified of sleep study results and recommendations. Patient voiced verbal understanding of what has been discussed.

## 2020-01-16 NOTE — Telephone Encounter (Signed)
CPAP orders sent to [facility]. 

## 2020-01-16 NOTE — Telephone Encounter (Signed)
Pt called the refill line wanting his sleep study results. Will route to Claiborne Rigg, CMA to contact pt.

## 2020-01-24 NOTE — Telephone Encounter (Signed)
Different phone note was made with a call back to pt.   Will close this encounter.

## 2020-02-04 ENCOUNTER — Other Ambulatory Visit (HOSPITAL_COMMUNITY): Payer: Self-pay | Admitting: Nurse Practitioner

## 2020-02-21 ENCOUNTER — Other Ambulatory Visit: Payer: Self-pay

## 2020-02-21 ENCOUNTER — Ambulatory Visit (INDEPENDENT_AMBULATORY_CARE_PROVIDER_SITE_OTHER): Payer: Managed Care, Other (non HMO) | Admitting: Sports Medicine

## 2020-02-21 VITALS — BP 124/78 | Ht 71.0 in | Wt 185.0 lb

## 2020-02-21 DIAGNOSIS — G8929 Other chronic pain: Secondary | ICD-10-CM | POA: Diagnosis not present

## 2020-02-21 DIAGNOSIS — M25512 Pain in left shoulder: Secondary | ICD-10-CM

## 2020-02-21 MED ORDER — CELEBREX 200 MG PO CAPS: 200.0000 mg | ORAL_CAPSULE | Freq: Every day | ORAL | 0 refills | Status: AC

## 2020-02-21 NOTE — Patient Instructions (Signed)
Take Celebrex for the next week.  If you don't have improvement after that, please call us and we can set up an epidural steroid injection.

## 2020-02-21 NOTE — Assessment & Plan Note (Addendum)
Given history of patient's pain with numbness and tingling, reassuring shoulder exam, and history of prior C5-C6 impingement in the neck that caused right shoulder pain, his most likely cause of pain is related to his neck.  He states that he has used Celebrex intermittently with some improvement, therefore would like to start with this.  Will prescribe Celebrex to use daily for the next 7 days.  After that time, if he does not have improvement in his pain, he is to call back and we will get him set up for an epidural injection in his neck given that his last MRI was in 2019.  He will return to care if no improvement after these interventions.

## 2020-02-21 NOTE — Progress Notes (Signed)
Mathew Davila is a 49 y.o. male who presents to [facility] today for the following:  Left shoulder pain Patient presents with several months of insidious onset left shoulder pain Reports he sleeps on his stomach with his arm abducted and hand on his forehead and thinks that this is exacerbating the problem Reports that the pain is on the outside of his arm and radiates down He reports arm numbness as well on the lateral aspect of his deltoid, overlying the brachioradialis, and the dorsal aspect of his thumb and pinky Reports that he does not have pain with overhead activity, but does notice pain when he is lifting a weight above his head Reports that he occasionally also has pain in the posterior portion of his shoulder into his trap and up into his neck He is recently started biking again and has been having more neck pain He denies any weakness in his arm Denies any known injuries States that he has never had any problems with this arm prior to this He is right-handed Had good improvement with prior epidural steroid injection of his neck for right arm pain   PMH reviewed.  ROS as above. Medications reviewed.  Exam:  BP 124/78   Ht 5\' 11"  (1.803 m)   Wt 185 lb (83.9 kg)   BMI 25.80 kg/m  Gen: Well NAD MSK:  Right Shoulder: Inspection reveals no obvious deformity, atrophy.  He does hold his left shoulder more superior than the right, increased tonicity of left trapezius.  No bruising. No swelling Palpation is normal with no TTP over [facility] joint or bicipital groove bilaterally.  He does have tenderness palpation of his left triceps. Full ROM in flexion, abduction, internal/external rotation bilaterally NV intact distally bilaterally Normal scapular function observed bilaterally Special Tests:  - Impingement: Positive [facility] - Supraspinatous: Negative empty can - Infraspinatous/Teres Minor: 5/5 strength with ER - Subscapularis: 5/5 strength with IR - Biceps tendon:  Negative Speeds  - Labrum: Positive Obriens, negative clunk, good stability - AC Joint: Negative cross arm - No painful arc and no drop arm sign  Neck: - Inspection: no gross deformity or asymmetry, swelling or ecchymosis - Palpation: No tenderness palpation of cervical spinous processes, no step-off noted, TTP left rhomboid and trapezius with pressure point at mid scapula level - ROM: full active ROM of the cervical spine with neck extension, rotation, flexion, somewhat limited and sidebending to right and left, feels tightness with this, otherwise no pain - Strength: 5/5 strength BUE in all fields, OK sign, interosseus strength intact bilaterally - Neuro: sensation intact in the C5-C8 nerve root distribution b/l, 2+ C5-C7 reflexes - Special testing: Negative Spurling's    No results found.   Assessment and Plan: 1) Chronic left shoulder pain Given history of patient's pain with numbness and tingling, reassuring shoulder exam, and history of prior C5-C6 impingement in the neck that caused right shoulder pain, his most likely cause of pain is related to his neck.  He states that he has used Celebrex intermittently with some improvement, therefore would like to start with this.  Will prescribe Celebrex to use daily for the next 7 days.  After that time, if he does not have improvement in his pain, he is to call back and we will get him set up for an epidural injection in his neck given that his last MRI was in 2019.  He will return to care if no improvement after these interventions.   2020, D.O.  PGY-3 Family Medicine  02/21/2020 4:52 PM   Patient seen and evaluated with the resident.  I agree with the above plan of care.  Patient's left shoulder pain is likely secondary to cervical radiculopathy.  He has a history of cervical radiculopathy affecting the right arm in 2019.  Symptoms improved with a single epidural steroid injection.  Require to start with Celebrex daily for  the next 7 days.  If he does not notice any relief, he will call the office in 1 week and we will refer him for another cervical ESI.  His last MRI was in 2019 so I do not think we need updated imaging prior to ordering the injection.

## 2020-03-06 ENCOUNTER — Encounter: Payer: Self-pay | Admitting: Sports Medicine

## 2020-03-06 ENCOUNTER — Other Ambulatory Visit: Payer: Self-pay

## 2020-03-06 ENCOUNTER — Ambulatory Visit (INDEPENDENT_AMBULATORY_CARE_PROVIDER_SITE_OTHER): Payer: Managed Care, Other (non HMO) | Admitting: Sports Medicine

## 2020-03-06 ENCOUNTER — Other Ambulatory Visit: Payer: Self-pay | Admitting: Sports Medicine

## 2020-03-06 VITALS — BP 140/82 | Ht 70.0 in | Wt 185.0 lb

## 2020-03-06 DIAGNOSIS — M5010 Cervical disc disorder with radiculopathy, unspecified cervical region: Secondary | ICD-10-CM

## 2020-03-06 MED ORDER — PREDNISONE 10 MG PO TABS: ORAL_TABLET | ORAL | 0 refills | Status: DC

## 2020-03-06 NOTE — Progress Notes (Signed)
   PCP: Blair Heys, MD  Subjective:   HPI: Patient is a 49 y.o. male with history of cervical radiculopathy in 2019 who is here for reevaluation of left-sided cervical radiculopathy.  He was seen on 02/21/20, at that time was having recurrence of left-sided shoulder pain and exam findings were most consistent with cervical radiculopathy.  He was treated conservatively with Celebrex with plan to follow-up if he is not having adequate improvement.  Today, patient states that he did not have any change in symptoms with the Celebrex.  He continues to have pain in his left trapezius and extending down into his hands with numbness and tingling sensation in his arm and hand.  He reports that the symptoms are very similar to the symptoms he had in 2019.  At that point, MRI showed C5-6 moderate spinal canal stenosis with mild bilateral neuroforaminal stenosis and C6-7 spinal stenosis with small disc protrusion.  He had an epidural injection done at [facility] imaging with resolution of his symptoms.   Review of Systems:  Per HPI.   PMFSH, medications and smoking status reviewed.      Objective:  Physical Exam:  [facility] 02/21/2020 03/06/2020  Frequency of aerobic exercise (# of days/week) 3 3  Average time in minutes 60 60  Frequency of strengthening activities (# of days/week) 3 3     Gen: awake, alert, NAD, comfortable in exam room Pulm: breathing unlabored  Neck: - Inspection: no gross deformity or asymmetry, swelling or ecchymosis - Palpation: No tenderness palpation of cervical spinous processes, no step-off noted, TTP left rhomboid and trapezius with pressure point at mid scapula level - ROM: full active ROM of the cervical spine with neck extension, rotation, flexion, some mild tightness and pain with lateral flexion bilaterally - Strength: 5/5 strength BUE in all fields, OK sign, interosseus strength intact bilaterally - Neuro: sensation intact in the  C5-C8 nerve root distribution b/l, 2+ C5-C7 reflexes - Special testing: Negative Spurling's   Assessment & Plan:  1.  Left-sided cervical radiculopathy  Patient with no improvement with conservative therapy, he is actually about to go on vacation to the [place], so we will give him a prednisone Dosepak to hopefully alleviate some of his symptoms while he is away, and will also schedule him for epidural steroid injection.  Follow-up as needed based on symptoms.   Guy Sandifer, MD [facility] Fellow 03/06/2020 9:01 AM   Patient seen and evaluated with the sports medicine fellow.  I agree with the above plan of care.  Patient will be referred to [facility] imaging for repeat cervical ESI.  Follow-up if symptoms persist afterwards.

## 2020-03-15 NOTE — Telephone Encounter (Signed)
PER SHARON AT CHOICE ,Patient was sent to [place].

## 2020-03-16 ENCOUNTER — Ambulatory Visit (HOSPITAL_COMMUNITY): Payer: Managed Care, Other (non HMO) | Admitting: Nurse Practitioner

## 2020-03-27 ENCOUNTER — Other Ambulatory Visit: Payer: Self-pay

## 2020-03-27 ENCOUNTER — Ambulatory Visit
Admission: RE | Admit: 2020-03-27 | Discharge: 2020-03-27 | Disposition: A | Discharge status: Home / Self Care | Payer: Managed Care, Other (non HMO) | Source: Ambulatory Visit | Attending: Sports Medicine | Admitting: Sports Medicine

## 2020-03-27 DIAGNOSIS — M5010 Cervical disc disorder with radiculopathy, unspecified cervical region: Secondary | ICD-10-CM

## 2020-03-27 MED ORDER — TRIAMCINOLONE ACETONIDE 40 MG/ML IJ SUSP (RADIOLOGY)
60.0000 mg | Freq: Once | INTRAMUSCULAR | Status: AC
  Administered 2020-03-27: 60 mg via EPIDURAL

## 2020-03-27 MED ORDER — IOPAMIDOL (ISOVUE-M 300) INJECTION 61%
1.0000 mL | Freq: Once | INTRAMUSCULAR | Status: AC | PRN
  Administered 2020-03-27: 1 mL via EPIDURAL

## 2020-03-27 NOTE — Discharge Instructions (Signed)

## 2020-06-21 ENCOUNTER — Other Ambulatory Visit (HOSPITAL_COMMUNITY): Payer: Self-pay | Admitting: Cardiology

## 2020-07-10 MED ORDER — LOSARTAN POTASSIUM 50 MG PO TABS: 50.0000 mg | ORAL_TABLET | Freq: Every day | ORAL | 3 refills | Status: DC

## 2020-09-08 NOTE — Progress Notes (Signed)
HPI: Follow-up atrial fibrillation.  Stress echocardiogram March 2019 normal.  Patient seen June 2021 after experiencing an episode of palpitations.  His apple watch apparently showed atrial fibrillation and the strips were reviewed by Rudi Coco and atrial fibrillation confirmed.  Patient converted spontaneously.  Echocardiogram June 2021 showed normal LV systolic function, grade 1 diastolic dysfunction, mild left ventricular hypertrophy, mild mitral regurgitation secondary to bileaflet mitral valve prolapse.  Patient was given Cardizem to take as needed.  Since last seen he has had several bouts of atrial fibrillation.  These last approximately 12 hours and resolved.  There is associated palpitations.  He otherwise denies dyspnea on exertion, orthopnea, PND, pedal edema, chest pain or syncope.  Current Outpatient Medications  Medication Sig Dispense Refill  . anastrozole (ARIMIDEX) 1 MG tablet Take 1 mg by mouth once a week.     . clomiPHENE (CLOMID) 50 MG tablet Take 50 mg by mouth. 4 TIMES A WEEK    . diltiazem (CARDIZEM) 30 MG tablet Take 1 tablet every 4 hours AS NEEDED for afib heart rate >100 45 tablet 1  . finasteride (PROPECIA) 1 MG tablet Take 1 mg by mouth daily.     Marland Kitchen ibuprofen (ADVIL,MOTRIN) 200 MG tablet Take 200 mg by mouth as needed.     Marland Kitchen losartan (COZAAR) 50 MG tablet Take 1 tablet (50 mg total) by mouth daily. 90 tablet 3   No current facility-administered medications for this visit.     Past Medical History:  Diagnosis Date  . Back pain     Past Surgical History:  Procedure Laterality Date  . KNEE SURGERY Right    right meniscus    Social History   Socioeconomic History  . Marital status: Married    Spouse name: Not on file  . Number of children: Not on file  . Years of education: Not on file  . Highest education level: Not on file  Occupational History  . Not on file  Tobacco Use  . Smoking status: Never Smoker  . Smokeless tobacco: Never Used   Substance and Sexual Activity  . Alcohol use: Yes  . Drug use: No  . Sexual activity: Not on file  Other Topics Concern  . Not on file  Social History Narrative  . Not on file   Social Determinants of Health   Financial Resource Strain: Not on file  Food Insecurity: Not on file  Transportation Needs: Not on file  Physical Activity: Not on file  Stress: Not on file  Social Connections: Not on file  Intimate Partner Violence: Not on file    Family History  Problem Relation Age of Onset  . Hypertension Father   . Heart attack Paternal Grandfather 64    ROS: no fevers or chills, productive cough, hemoptysis, dysphasia, odynophagia, melena, hematochezia, dysuria, hematuria, rash, seizure activity, orthopnea, PND, pedal edema, claudication. Remaining systems are negative.  Physical Exam: Well-developed well-nourished in no acute distress.  Skin is warm and dry.  HEENT is normal.  Neck is supple.  Chest is clear to auscultation with normal expansion.  Cardiovascular exam is regular rate and rhythm.  Abdominal exam nontender or distended. No masses palpated. Extremities show no edema. neuro grossly intact  ECG-sinus bradycardia at a rate of 55, left ventricular hypertrophy with repolarization abnormality.  Personally reviewed  A/P  1 paroxysmal atrial fibrillation-patient remains in sinus rhythm.  Continue Cardizem as needed.  Note LV function is normal.  His CHA2DS2-VASc is 1  for hypertension.  We will continue aspirin (he prefers this over apixaban).  Can consider addition of antiarrhythmic versus referral for ablation in the future if symptoms worsen.  Note he does have sleep apnea and has not tolerated CPAP.  I will ask pulmonary to evaluate for other options.  2 hypertension-blood pressure elevated.  Increase losartan to 100 mg daily.  Check potassium and renal function in 1 week.  We will also check lipids at that time.  We will also arrange a calcium score for risk  stratification.  3 bileaflet mitral valve prolapse with mild mitral regurgitation-he will need follow-up echoes in the future.  4 sleep apnea-we will refer to pulmonary as outlined above.  Olga Millers, MD

## 2020-09-18 ENCOUNTER — Other Ambulatory Visit: Payer: Self-pay

## 2020-09-18 ENCOUNTER — Encounter: Payer: Self-pay | Admitting: Cardiology

## 2020-09-18 ENCOUNTER — Ambulatory Visit (INDEPENDENT_AMBULATORY_CARE_PROVIDER_SITE_OTHER): Payer: Managed Care, Other (non HMO) | Admitting: Cardiology

## 2020-09-18 VITALS — BP 148/78 | HR 53 | Ht 71.0 in | Wt 188.2 lb

## 2020-09-18 DIAGNOSIS — I480 Paroxysmal atrial fibrillation: Secondary | ICD-10-CM

## 2020-09-18 DIAGNOSIS — R0683 Snoring: Secondary | ICD-10-CM | POA: Diagnosis not present

## 2020-09-18 DIAGNOSIS — Z136 Encounter for screening for cardiovascular disorders: Secondary | ICD-10-CM | POA: Diagnosis not present

## 2020-09-18 MED ORDER — LOSARTAN POTASSIUM 100 MG PO TABS
100.0000 mg | ORAL_TABLET | Freq: Every day | ORAL | 3 refills | Status: DC
Start: 2020-09-18 — End: 2021-12-09

## 2020-09-18 NOTE — Patient Instructions (Signed)
Medication Instructions:   INCREASE LOSARTAN TO 100 MG ONCE DAILY= 2 OF THE 50 MG TABLETS ONCE DAILY  *If you need a refill on your cardiac medications before your next appointment, please call your pharmacy*   Lab Work:  Your physician recommends that you return for lab work in: [facility]  If you have labs (blood work) drawn today and your tests are completely normal, you will receive your results only by: Marland Kitchen MyChart Message (if you have MyChart) OR . A paper copy in the mail If you have any lab test that is abnormal or we need to change your treatment, we will call you to review the results.   Testing/Procedures:  CORONARY CALCIUM SCORING AT [address]   Follow-Up: At [facility], you and your health needs are our priority.  As part of our continuing mission to provide you with exceptional heart care, we have created designated Provider Care Teams.  These Care Teams include your primary Cardiologist (physician) and Advanced Practice Providers (APPs -  Physician Assistants and Nurse Practitioners) who all work together to provide you with the care you need, when you need it.  We recommend signing up for the patient portal called "MyChart".  Sign up information is provided on this After Visit Summary.  MyChart is used to connect with patients for Virtual Visits (Telemedicine).  Patients are able to view lab/test results, encounter notes, upcoming appointments, etc.  Non-urgent messages can be sent to your provider as well.   To learn more about what you can do with MyChart, go to [email or web address].    Your next appointment:   12 month(s)  The format for your next appointment:   In Person  Provider:   Olga Millers, MD

## 2020-09-21 ENCOUNTER — Telehealth: Payer: Self-pay | Admitting: *Deleted

## 2020-09-21 NOTE — Telephone Encounter (Signed)
A message was left, re:his referral to [facility] office on June 9 th. Appointment mailed.

## 2020-09-25 ENCOUNTER — Ambulatory Visit
Admission: RE | Admit: 2020-09-25 | Discharge: 2020-09-25 | Disposition: A | Discharge status: Home / Self Care | Payer: Self-pay | Source: Ambulatory Visit | Attending: Cardiology | Admitting: Cardiology

## 2020-09-25 ENCOUNTER — Other Ambulatory Visit: Payer: Self-pay

## 2020-09-25 DIAGNOSIS — Z136 Encounter for screening for cardiovascular disorders: Secondary | ICD-10-CM

## 2020-10-04 ENCOUNTER — Other Ambulatory Visit: Payer: Self-pay

## 2020-10-04 ENCOUNTER — Encounter: Payer: Self-pay | Admitting: Pulmonary Disease

## 2020-10-04 ENCOUNTER — Ambulatory Visit (INDEPENDENT_AMBULATORY_CARE_PROVIDER_SITE_OTHER): Payer: Managed Care, Other (non HMO) | Admitting: Pulmonary Disease

## 2020-10-04 DIAGNOSIS — G4733 Obstructive sleep apnea (adult) (pediatric): Secondary | ICD-10-CM | POA: Diagnosis not present

## 2020-10-04 NOTE — Progress Notes (Signed)
Subjective:    Patient ID: Mathew Davila, male    DOB: [birth year], 50 y.o.   MRN: [id number]  HPI  Chief Complaint  Patient presents with   Consult    Patient had sleep study done last summer 01/05/20. Patient states that he sleeps on his stomach and is all over the bed and states that he can't wear the cpap. Wants to talk about other options.    50 year old with paroxysmal atrial fibrillation referred by cardiology to evaluate for obstructive sleep apnea OSA was diagnosed by a sleep study done 12/2019 which showed moderate OSA with AHI 19/hour, worse in the supine position 33/hour.  I have reviewed raw data from the sleep study. PMH -paroxysmal atrial fibrillation, low T, hypertension  He reports excessive daytime somnolence and fatigue.  Epworth sleepiness score is 10 and he reports sleepiness while sitting and reading, lying down to rest in the afternoons or sitting quietly after lunch. Bedtime is between 10-11 PM, sleep latency is minimal, he generally reads on his iPad before sleeping, prefers to sleep on his stomach, he has increased gasping episodes in his back, reports 2-3 nocturnal awakenings without" sleep latency and is out of bed by 7 AM feeling sluggish with dryness of mouth but denies headaches. There is no history suggestive of cataplexy, sleep paralysis or parasomnias He reports increased use of caffeinated beverages, he drinks 3 cups of coffee in the morning and 1 shot of espresso in the afternoon. He will nap for about 30 minutes in the afternoon and naps are refreshing  After his sleep study he was prescribed CPAP which he attempted to use with nasal mask but did not tolerate this.  He would like to discuss other options    Significant tests/ events reviewed  HST 04/2019 AHI 19/hour, supine AHI 33/hour   Past Medical History:  Diagnosis Date   Back pain    Past Surgical History:  Procedure Laterality Date   KNEE SURGERY Right    right meniscus     No  Known Allergies  Social History   Socioeconomic History   Marital status: Married    Spouse name: Not on file   Number of children: Not on file   Years of education: Not on file   Highest education level: Not on file  Occupational History   Not on file  Tobacco Use   Smoking status: Never   Smokeless tobacco: Never  Vaping Use   Vaping Use: Never used  Substance and Sexual Activity   Alcohol use: Yes   Drug use: No   Sexual activity: Not on file  Other Topics Concern   Not on file  Social History Narrative   Not on file   Social Determinants of Health   Financial Resource Strain: Not on file  Food Insecurity: Not on file  Transportation Needs: Not on file  Physical Activity: Not on file  Stress: Not on file  Social Connections: Not on file  Intimate Partner Violence: Not on file    Family History  Problem Relation Age of Onset   Colon cancer Father    Hypertension Father    Heart attack Paternal Grandfather 81      Review of Systems  Constitutional: negative for anorexia, fevers and sweats  Eyes: negative for irritation, redness and visual disturbance  Ears, nose, mouth, throat, and face: negative for earaches, epistaxis, nasal congestion and sore throat  Respiratory: negative for cough, dyspnea on exertion, sputum and wheezing  Cardiovascular: negative  for chest pain, dyspnea, lower extremity edema, orthopnea, palpitations and syncope  Gastrointestinal: negative for abdominal pain, constipation, diarrhea, melena, nausea and vomiting  Genitourinary:negative for dysuria, frequency and hematuria  Hematologic/lymphatic: negative for bleeding, easy bruising and lymphadenopathy  Musculoskeletal:negative for arthralgias, muscle weakness and stiff joints  Neurological: negative for coordination problems, gait problems, headaches and weakness  Endocrine: negative for diabetic symptoms including polydipsia, polyuria and weight loss     Objective:   Physical  Exam  Gen. Pleasant, well-nourished, in no distress, normal affect ENT - no pallor,icterus, no post nasal drip Neck: No JVD, no thyromegaly, no carotid bruits Lungs: no use of accessory muscles, no dullness to percussion, clear without rales or rhonchi  Cardiovascular: Rhythm regular, heart sounds  normal, no murmurs or gallops, no peripheral edema Abdomen: soft and non-tender, no hepatosplenomegaly, BS normal. Musculoskeletal: No deformities, no cyanosis or clubbing Neuro:  alert, non focal       Assessment & Plan:

## 2020-10-04 NOTE — Assessment & Plan Note (Addendum)
moderate sleep apnea, worse in the supine position  Trial of Respironics DreamWear gel cushion with your CPAP device. If you are able to wear this for 2 weeks, call us and we will obtain download from [employer] and adjust CPAP settings if needed.  If unable to adjust, plan B  trial of melatonin 5 mg [up to 10 mg] + CPAP  Still unable to, call us and plan C -we will schedule formal CPAP titration study  Alternative would be dental referral for oral appliance + avoid supine position We discussed inspire device and he is not very keen on this at this time

## 2020-10-04 NOTE — Patient Instructions (Signed)
You have moderate sleep apnea, worse in the supine position  Trial of Respironics DreamWear gel cushion with your CPAP device. If you are able to wear this for 2 weeks, call us and we will obtain download from [employer] and adjust CPAP settings if needed.  If unable to adjust, trial of melatonin 5 mg [up to 10 mg] + CPAP  Still unable to, call us and we will schedule formal CPAP titration study  Alternative would be dental referral for oral appliance

## 2020-12-21 LAB — LIPID PANEL
Chol/HDL Ratio: 5.5 ratio — ABNORMAL HIGH (ref 0.0–5.0)
Cholesterol, Total: 197 mg/dL (ref 100–199)
HDL: 36 mg/dL — ABNORMAL LOW (ref >39)
LDL Chol Calc (NIH): 150 mg/dL — ABNORMAL HIGH (ref 0–99)
Triglycerides: 60 mg/dL (ref 0–149)
VLDL Cholesterol Cal: 11 mg/dL (ref 5–40)

## 2020-12-21 LAB — COMPREHENSIVE METABOLIC PANEL
ALT: 26 IU/L (ref 0–44)
AST: 32 IU/L (ref 0–40)
Albumin/Globulin Ratio: 2.3 — ABNORMAL HIGH (ref 1.2–2.2)
Albumin: 4.6 g/dL (ref 3.8–4.9)
Alkaline Phosphatase: 49 IU/L (ref 44–121)
BUN/Creatinine Ratio: 14 (ref 9–20)
BUN: 15 mg/dL (ref 6–24)
Bilirubin Total: 0.3 mg/dL (ref 0.0–1.2)
CO2: 23 mmol/L (ref 20–29)
Calcium: 9.1 mg/dL (ref 8.7–10.2)
Chloride: 103 mmol/L (ref 96–106)
Creatinine, Ser: 1.08 mg/dL (ref 0.76–1.27)
Globulin, Total: 2 g/dL (ref 1.5–4.5)
Glucose: 107 mg/dL — ABNORMAL HIGH (ref 65–99)
Potassium: 4.7 mmol/L (ref 3.5–5.2)
Sodium: 140 mmol/L (ref 134–144)
Total Protein: 6.6 g/dL (ref 6.0–8.5)
eGFR: 83 mL/min/{1.73_m2} (ref >59)

## 2020-12-24 ENCOUNTER — Telehealth: Payer: Self-pay | Admitting: Cardiology

## 2020-12-24 DIAGNOSIS — I10 Essential (primary) hypertension: Secondary | ICD-10-CM

## 2020-12-24 DIAGNOSIS — Z79899 Other long term (current) drug therapy: Secondary | ICD-10-CM

## 2020-12-24 MED ORDER — ROSUVASTATIN CALCIUM 20 MG PO TABS
20.0000 mg | ORAL_TABLET | Freq: Every day | ORAL | 3 refills | Status: DC
Start: 2020-12-24 — End: 2021-03-20

## 2020-12-24 NOTE — Telephone Encounter (Signed)
LDL 150; would add crestor 20 mg daily with lipids and liver 12 weeks-Brian Bouton

## 2020-12-24 NOTE — Telephone Encounter (Signed)
Pt is returning call from 30 minutes ago to Gavin Pound in regards to his test results. Please advise pt further

## 2021-03-20 ENCOUNTER — Other Ambulatory Visit: Payer: Self-pay | Admitting: Cardiology

## 2021-06-08 ENCOUNTER — Other Ambulatory Visit: Payer: Self-pay

## 2021-06-08 ENCOUNTER — Emergency Department (HOSPITAL_BASED_OUTPATIENT_CLINIC_OR_DEPARTMENT_OTHER)
Admission: EM | Admit: 2021-06-08 | Discharge: 2021-06-08 | Disposition: A | Discharge status: Home / Self Care | Payer: Managed Care, Other (non HMO) | Attending: Emergency Medicine | Admitting: Emergency Medicine

## 2021-06-08 ENCOUNTER — Emergency Department (HOSPITAL_BASED_OUTPATIENT_CLINIC_OR_DEPARTMENT_OTHER): Payer: Managed Care, Other (non HMO) | Admitting: Radiology

## 2021-06-08 DIAGNOSIS — I4891 Unspecified atrial fibrillation: Secondary | ICD-10-CM | POA: Diagnosis not present

## 2021-06-08 DIAGNOSIS — I10 Essential (primary) hypertension: Secondary | ICD-10-CM | POA: Diagnosis not present

## 2021-06-08 DIAGNOSIS — M79672 Pain in left foot: Secondary | ICD-10-CM

## 2021-06-08 LAB — CBC WITH DIFFERENTIAL/PLATELET
Abs Immature Granulocytes: 0.01 10*3/uL (ref 0.00–0.07)
Basophils Absolute: 0 10*3/uL (ref 0.0–0.1)
Basophils Relative: 1 %
Eosinophils Absolute: 0.1 10*3/uL (ref 0.0–0.5)
Eosinophils Relative: 2 %
HCT: 41.6 % (ref 39.0–52.0)
Hemoglobin: 14.2 g/dL (ref 13.0–17.0)
Immature Granulocytes: 0 %
Lymphocytes Relative: 21 %
Lymphs Abs: 1 10*3/uL (ref 0.7–4.0)
MCH: 30.8 pg (ref 26.0–34.0)
MCHC: 34.1 g/dL (ref 30.0–36.0)
MCV: 90.2 fL (ref 80.0–100.0)
Monocytes Absolute: 0.2 10*3/uL (ref 0.1–1.0)
Monocytes Relative: 5 %
Neutro Abs: 3.5 10*3/uL (ref 1.7–7.7)
Neutrophils Relative %: 71 %
Platelets: 146 10*3/uL — ABNORMAL LOW (ref 150–400)
RBC: 4.61 MIL/uL (ref 4.22–5.81)
RDW: 13.2 % (ref 11.5–15.5)
WBC: 5 10*3/uL (ref 4.0–10.5)
nRBC: 0 % (ref 0.0–0.2)

## 2021-06-08 LAB — BASIC METABOLIC PANEL
Anion gap: 8 (ref 5–15)
BUN: 12 mg/dL (ref 6–20)
CO2: 28 mmol/L (ref 22–32)
Calcium: 9 mg/dL (ref 8.9–10.3)
Chloride: 105 mmol/L (ref 98–111)
Creatinine, Ser: 1 mg/dL (ref 0.61–1.24)
GFR, Estimated: >60 mL/min (ref >60)
Glucose, Bld: 98 mg/dL (ref 70–99)
Potassium: 4.4 mmol/L (ref 3.5–5.1)
Sodium: 141 mmol/L (ref 135–145)

## 2021-06-08 LAB — URIC ACID: Uric Acid, Serum: 5.5 mg/dL (ref 3.7–8.6)

## 2021-06-08 MED ORDER — CEPHALEXIN 500 MG PO CAPS: 500.0000 mg | ORAL_CAPSULE | Freq: Three times a day (TID) | ORAL | 0 refills | Status: AC

## 2021-06-08 MED ORDER — PREDNISONE 10 MG PO TABS: 40.0000 mg | ORAL_TABLET | Freq: Every day | ORAL | 0 refills | Status: AC

## 2021-06-08 NOTE — ED Provider Notes (Signed)
[place] EMERGENCY DEPT Provider Note   CSN: [id number] Arrival date & time: 06/08/21  0736     History  Chief Complaint  Patient presents with   Foot Pain    Mathew Davila is a 51 y.o. male.  HPI     51yo male with history of atrial fibrillation (on aspirin), sleep apnea, hypertension, bileavlet mitral valve prolapse, who presents with concern for left foot pain.  Reports that he rides a bike and recently got a new bike shoes which were too tight on the top, initially had foot pain and thought it was secondary to this.  Reports he began riding a lot after not riding for a long period of time.  On Monday, he began to have pain over his first MTP is severe.  Reports when he first stands up and then improves as he walks for the day.  He is able to bear weight and walk on it, however with a limp.  He has been taking 800 mg of ibuprofen 4 times a day.  He has no fevers, no history of IV drug use, no history of blood or bacterial infection.  He does note a history of pericarditis in the past.  He has no history of gout, but does report he appreciates red wine and red meat and hopes that he does not have it.  He has no family history of gout. No specific injuries to his toe.  He has not been regularly taking his aspirin for afib but notes he has not had many episodes per his apple watch. No other leg pain or swelling, chest pain or dyspnea.  Pain is severe, worse at night when he is trying to sleep and when he first stands but seems to improve during the day.   Past Medical History:  Diagnosis Date   Back pain     Past Surgical History:  Procedure Laterality Date   KNEE SURGERY Right    right meniscus     Home Medications Prior to Admission medications   Medication Sig Start Date End Date Taking? Authorizing Provider  cephALEXin (KEFLEX) 500 MG capsule Take 1 capsule (500 mg total) by mouth 3 (three) times daily for 7 days. 06/08/21 06/15/21 Yes Gareth Morgan, MD  predniSONE (DELTASONE) 10 MG tablet Take 4 tablets (40 mg total) by mouth daily for 3 days. 06/08/21 06/11/21 Yes Gareth Morgan, MD  anastrozole (ARIMIDEX) 1 MG tablet Take 1 mg by mouth once a week.  07/14/19   [provider]  clomiPHENE (CLOMID) 50 MG tablet Take 50 mg by mouth. 4 TIMES A WEEK    [provider]  diltiazem (CARDIZEM) 30 MG tablet Take 1 tablet every 4 hours AS NEEDED for afib heart rate >100 10/04/19   Sherran Needs, NP  finasteride (PROPECIA) 1 MG tablet Take 1 mg by mouth daily.     [provider]  ibuprofen (ADVIL,MOTRIN) 200 MG tablet Take 200 mg by mouth as needed.     [provider]  losartan (COZAAR) 100 MG tablet Take 1 tablet (100 mg total) by mouth daily. 09/18/20 12/17/20  Lelon Perla, MD  rosuvastatin (CRESTOR) 20 MG tablet TAKE 1 TABLET BY MOUTH EVERY DAY 03/20/21   Lelon Perla, MD      Allergies    Patient has no known allergies.    Review of Systems   Review of Systems  Physical Exam Updated Vital Signs BP (!) 143/88 (BP Location: Right Arm)  Pulse 94    Temp 97.9 F (36.6 C) (Oral)    Resp 18    Ht 5\' 11"  (1.803 m)    Wt 85.7 kg    SpO2 100%    BMI 26.36 kg/m  Physical Exam Vitals and nursing note reviewed.  Constitutional:      General: He is not in acute distress.    Appearance: Normal appearance. He is not ill-appearing, toxic-appearing or diaphoretic.  HENT:     Head: Normocephalic.  Eyes:     Conjunctiva/sclera: Conjunctivae normal.  Cardiovascular:     Rate and Rhythm: Normal rate and regular rhythm.     Pulses: Normal pulses.  Pulmonary:     Effort: Pulmonary effort is normal. No respiratory distress.  Musculoskeletal:        General: Swelling and tenderness (first MTP with redness, tenderness) present. No deformity or signs of injury.     Cervical back: No rigidity.  Skin:    General: Skin is warm and dry.     Coloration: Skin is not jaundiced or pale.  Neurological:      General: No focal deficit present.     Mental Status: He is alert and oriented to person, place, and time.    ED Results / Procedures / Treatments   Labs (all labs ordered are listed, but only abnormal results are displayed) Labs Reviewed  CBC WITH DIFFERENTIAL/PLATELET - Abnormal; Notable for the following components:      Result Value   Platelets 146 (*)    All other components within normal limits  BASIC METABOLIC PANEL  URIC ACID    EKG None  Radiology DG Foot Complete Left  Result Date: 06/08/2021 CLINICAL DATA:  C/o pain/redness around MTP joint of Lt 1st (great) toe x 5-6 days. Patient states he started cycling again just before that with new shoes that felt too tight but wasn't sure if it was that or possibly gout (no history of gout). EXAM: LEFT FOOT - COMPLETE 3+ VIEW COMPARISON:  None. FINDINGS: No fracture or bone lesion. The joints are normally spaced and aligned. No arthropathic changes. Mild soft tissue swelling at the first MTP joint. Soft tissues otherwise unremarkable. IMPRESSION: 1. No fracture, bone lesion or joint abnormality. Electronically Signed   By: Lajean Manes M.D.   On: 06/08/2021 08:34    Procedures Procedures    Medications Ordered in ED Medications - No data to display  ED Course/ Medical Decision Making/ A&P                           Medical Decision Making Amount and/or Complexity of Data Reviewed Labs: ordered. Radiology: ordered.  Risk Prescription drug management.    51yo male with history of atrial fibrillation (on aspirin), sleep apnea, hypertension, bileavlet mitral valve prolapse, who presents with concern for left foot pain.  DDx includes arterial or venous thrombus, cellulitis, septic arthritis, osteomyelitis, inflammatory arthritis, stress fracture, gout.  XR personally interpreted by me and radiology shows no evidence of fracture.  Normal pulses, no sign of acute arterial thrombus and no clinical sign of DVT. No fever or  risk factors to suggest osteomyelitis, septic arthritis or cellulitis and location is suspicious for gout.  It is difficult, however, to exclude cellulitis and somewhat unusual that he feels improved with movement.  Labs obtained personally reviewed and interpreted by me which show no leukocytosis, normal renal function, uric acid 5.5.  Falls in moderate category of gout  suspicion per scoring. (7.5 with above 8 being high suspicion and urica cid above 5.88 making high sucpicion)  I do feel gout is most likely diagnosis however it is difficult to exclude cellulitis due to microtrauma related t shoes and difficult to exclude other inflammatory processes.  He does not have risk factors for infection/septic arthritis.  Will treat for possible cellulitis with keflex and given prednisone for suspected gout flare.  Recommend PCP follow up. Patient discharged in stable condition with understanding of reasons to return.           Final Clinical Impression(s) / ED Diagnoses Final diagnoses:  Left foot pain    Rx / DC Orders ED Discharge Orders          Ordered    cephALEXin (KEFLEX) 500 MG capsule  3 times daily        06/08/21 1055    predniSONE (DELTASONE) 10 MG tablet  Daily        06/08/21 1055              Gareth Morgan, MD 06/08/21 2315

## 2021-06-08 NOTE — ED Notes (Signed)
Dc instructions reviewed with patient. Patient voiced understanding. Dc with belongings.  °

## 2021-06-08 NOTE — ED Triage Notes (Signed)
Pt c/o foot left pain for the past 5 to 6 days. Pt can walk but states he has intermittent pain with same.

## 2021-09-28 ENCOUNTER — Other Ambulatory Visit: Payer: Self-pay | Admitting: Cardiology

## 2021-10-24 ENCOUNTER — Ambulatory Visit (INDEPENDENT_AMBULATORY_CARE_PROVIDER_SITE_OTHER): Payer: Managed Care, Other (non HMO) | Admitting: Sports Medicine

## 2021-10-24 VITALS — BP 132/84 | Ht 71.0 in | Wt 185.0 lb

## 2021-10-24 DIAGNOSIS — M5010 Cervical disc disorder with radiculopathy, unspecified cervical region: Secondary | ICD-10-CM | POA: Insufficient documentation

## 2021-10-24 DIAGNOSIS — M5412 Radiculopathy, cervical region: Secondary | ICD-10-CM | POA: Diagnosis not present

## 2021-10-24 MED ORDER — GABAPENTIN 300 MG PO CAPS: 300.0000 mg | ORAL_CAPSULE | Freq: Every day | ORAL | 2 refills | Status: DC

## 2021-10-24 NOTE — Assessment & Plan Note (Signed)
Repeat cervical spine films Isometric exercises T and I stretches Low-dose gabapentin at nighttime Recheck in 1 month

## 2021-10-24 NOTE — Progress Notes (Signed)
Established Patient Office Visit  Subjective   Patient ID: Mathew Davila, male    DOB: 04-Aug-1970  Age: 51 y.o. MRN: [id number]   HPI  Mathew Davila is a 51 year old male who presents for left shoulder pain that has been ongoing for the past 8 months.  Pain seems to originate at the top of his left shoulder near the trapezius muscle and base of his neck with radicular symptoms radiating down the left upper extremity to the hand and fingers.  He denies any weakness or loss of sensation.  He has recently been doing some frequent home renovation and has been sleeping without the use of his normal pillow under his head and shoulder as he normally does.  He has a history of left-sided cervical radiculopathy in 2019 / last seen in October 2021 for recurrence of left shoulder secondary to cervical radiculopathy that was treated conservatively.  MRI from 2019 demonstrated C5-6 moderate spinal canal stenosis with mild bilateral neuroforaminal stenosis at C6-7 spinal stenosis with a small disc protrusion.  He has not had any recent repeat imaging.  He denies any recent trauma or injury to the neck or shoulder.  The pain has been affecting his sleep with decreased sleep recently over the past several weeks.  He takes ibuprofen as needed for lumbar pain but has not noticed any improvement in neck/shoulder pain.  Past Medical History:  Diagnosis Date   Back pain    Past Surgical History:  Procedure Laterality Date   KNEE SURGERY Right    right meniscus   Social History   Tobacco Use   Smoking status: Never   Smokeless tobacco: Never  Vaping Use   Vaping Use: Never used  Substance Use Topics   Alcohol use: Yes   Drug use: No   No Known Allergies None  ROS As noted above    Objective:     BP 132/84   Ht 5\' 11"  (1.803 m)   Wt 185 lb (83.9 kg)   BMI 25.80 kg/m   Physical Exam General: No acute distress, sitting comfortably  Neck Inspection: No gross deformities or  asymmetries, no swelling or ecchymosis.  No lesions, ulcerations, or rashes. Palpation: TTP over left rhomboid and trapezius.  No TTP of cervical spinous processes.  No step-offs or crepitus. ROM: 10 degrees of neck extension elicited symptoms of left arm radiculopathy.  Decreased neck rotation to the left 5 to 10 degrees, normal rotation to the right.  Tightness with lateral neck flexion bilaterally.  Normal neck flexion. Head forward position Strength: 5/5 strength bilateral upper extremities in all Keiffer Piper. Neurovascular: Neurovascularly intact, sensation intact distally along C5-C8 nerve root distribution, intact biceps reflexes.  Left shoulder Inspection: No gross deformities or asymmetries, no swelling or ecchymosis.  No lesions, ulcerations, or rashes. Palpation: No TTP over the acromion, AC joint, clavicle, or bicipital groove. ROM: Full passive and active ROM in flexion, extension, abduction, internal and external rotation bilaterally. Strength: 5/5 strength bilateral upper extremities in all Thurma Priego. Special tests: Negative Hawkins.  Negative empty can.  Negative liftoff.  Negative Yergason and speeds.  Negative O'Brien's.  Negative crank test.  Negative crossarm.    Assessment & Plan:   Problem List Items Addressed This Visit   None   No follow-ups on file.   1.  Left-sided cervical radiculopathy - Obtain cervical spine x-ray to reevaluate for any interval changes - Neck fisometric exercises and stretches to do at home - We will start 300 mg gabapentin  nightly to help with neuropathic pain and sleep - Discussed improving ergonomics at home and at work with frequent desk/computer work - Can continue Tylenol and ibuprofen as needed for pain and discomfort - If no improvement, can consider formal PT - Follow-up in 4 weeks   Chyrel Masson, MD FM PGY-2  I observed and examined the patient with the resident and agree with assessment and plan.  Note reviewed and modified  by me. Sterling Big, MD

## 2021-10-31 ENCOUNTER — Ambulatory Visit
Admission: RE | Admit: 2021-10-31 | Discharge: 2021-10-31 | Disposition: A | Discharge status: Home / Self Care | Payer: Managed Care, Other (non HMO) | Source: Ambulatory Visit | Attending: Sports Medicine | Admitting: Sports Medicine

## 2021-10-31 DIAGNOSIS — M5412 Radiculopathy, cervical region: Secondary | ICD-10-CM

## 2021-11-09 ENCOUNTER — Emergency Department (HOSPITAL_BASED_OUTPATIENT_CLINIC_OR_DEPARTMENT_OTHER)
Admission: EM | Admit: 2021-11-09 | Discharge: 2021-11-09 | Disposition: A | Discharge status: Home / Self Care | Payer: Managed Care, Other (non HMO) | Attending: Emergency Medicine | Admitting: Emergency Medicine

## 2021-11-09 ENCOUNTER — Emergency Department (HOSPITAL_BASED_OUTPATIENT_CLINIC_OR_DEPARTMENT_OTHER): Payer: Managed Care, Other (non HMO)

## 2021-11-09 ENCOUNTER — Encounter (HOSPITAL_BASED_OUTPATIENT_CLINIC_OR_DEPARTMENT_OTHER): Payer: Self-pay | Admitting: Emergency Medicine

## 2021-11-09 ENCOUNTER — Other Ambulatory Visit: Payer: Self-pay

## 2021-11-09 DIAGNOSIS — W228XXA Striking against or struck by other objects, initial encounter: Secondary | ICD-10-CM | POA: Diagnosis not present

## 2021-11-09 DIAGNOSIS — Y9371 Activity, boxing: Secondary | ICD-10-CM | POA: Diagnosis not present

## 2021-11-09 DIAGNOSIS — S6991XA Unspecified injury of right wrist, hand and finger(s), initial encounter: Secondary | ICD-10-CM | POA: Diagnosis present

## 2021-11-09 HISTORY — DX: Pure hypercholesterolemia, unspecified: E78.00

## 2021-11-09 NOTE — ED Provider Notes (Signed)
[facility] EMERGENCY DEPT Provider Note   CSN: [id number] Arrival date & time: 11/09/21  1048     History  Chief Complaint  Patient presents with   Hand Injury    Mathew Davila is a 51 y.o. male.  Mathew Davila is a 51 y.o. male with history of hyperlipidemia and back pain, who presents to the ED for evaluation of right hand injury.  He reports that he was exercising and punching a heavy punching bag and since then has had pain in the right hand only over the knuckles of his third fourth and fifth fingers with some slight swelling and he was concern for fracture.  No numbness tingling or weakness.  No wounds.  Reports he typically wears a brace when using the punching bag but did not have this on  The history is provided by the patient.  Hand Injury      Home Medications Prior to Admission medications   Medication Sig Start Date End Date Taking? Authorizing Provider  anastrozole (ARIMIDEX) 1 MG tablet Take 1 mg by mouth once a week.  07/14/19   [provider]  clomiPHENE (CLOMID) 50 MG tablet Take 50 mg by mouth. 4 TIMES A WEEK    [provider]  diltiazem (CARDIZEM) 30 MG tablet Take 1 tablet every 4 hours AS NEEDED for afib heart rate >100 10/04/19   Newman Nip, NP  finasteride (PROPECIA) 1 MG tablet Take 1 mg by mouth daily.     [provider]  gabapentin (NEURONTIN) 300 MG capsule Take 1 capsule (300 mg total) by mouth at bedtime. 10/24/21   Enid Baas, MD  ibuprofen (ADVIL,MOTRIN) 200 MG tablet Take 200 mg by mouth as needed.     [provider]  losartan (COZAAR) 100 MG tablet Take 1 tablet (100 mg total) by mouth daily. 09/18/20 12/17/20  Lewayne Bunting, MD  rosuvastatin (CRESTOR) 20 MG tablet TAKE 1 TABLET BY MOUTH EVERY DAY 03/20/21   Lewayne Bunting, MD      Allergies    Patient has no known allergies.    Review of Systems   Review of Systems  Constitutional:  Negative for chills.   Musculoskeletal:  Positive for arthralgias.    Physical Exam Updated Vital Signs BP 134/85 (BP Location: Right Arm)   Pulse (!) 57   Temp 98.6 F (37 C)   Resp 19   SpO2 98%  Physical Exam Vitals and nursing note reviewed.  Constitutional:      General: He is not in acute distress.    Appearance: Normal appearance. He is well-developed. He is not ill-appearing or diaphoretic.  HENT:     Head: Normocephalic and atraumatic.  Eyes:     General:        Right eye: No discharge.        Left eye: No discharge.  Pulmonary:     Effort: Pulmonary effort is normal. No respiratory distress.  Musculoskeletal:     Comments: Slight tenderness and swelling over the third fourth and fifth MCP joints of the right hand without palpable bony deformity, no overlying wounds, no tenderness at the wrist, 2+ radial pulse and good cap refill, normal grip strength  Neurological:     Mental Status: He is alert and oriented to person, place, and time.     Coordination: Coordination normal.  Psychiatric:        Mood and Affect: Mood normal.        Behavior:  Behavior normal.     ED Results / Procedures / Treatments   Labs (all labs ordered are listed, but only abnormal results are displayed) Labs Reviewed - No data to display  EKG None  Radiology DG Hand Complete Right  Result Date: 11/09/2021 CLINICAL DATA:  Right hand pain after punching injury EXAM: RIGHT HAND - COMPLETE 3+ VIEW COMPARISON:  None Available. FINDINGS: There is no evidence of fracture or dislocation. There is no evidence of arthropathy or other focal bone abnormality. Soft tissues are unremarkable. IMPRESSION: No right hand fracture or dislocation. Electronically Signed   By: Delbert Phenix M.D.   On: 11/09/2021 12:05    Procedures Procedures    Medications Ordered in ED Medications - No data to display  ED Course/ Medical Decision Making/ A&P                           Medical Decision Making Amount and/or Complexity of  Data Reviewed Radiology: ordered.   51 year old male presents with right hand pain after injuring it while punching a punching bag.  No significant deformity on exam and the hand is neurovascularly intact.  No scaphoid tenderness on exam.  I ordered x-rays of the right hand and have personally viewed and interpreted imaging with no evidence of fracture or dislocation.  Likely strain or soft tissue injury.  Encouraged rest ice compression elevation and NSAIDs as needed for pain.  Patient expresses understanding and agreement with plan.  Discharged home in good condition.        Final Clinical Impression(s) / ED Diagnoses Final diagnoses:  Hand injury, right, initial encounter    Rx / DC Orders ED Discharge Orders     None         Legrand Rams 11/09/21 1240    Alvira Monday, MD 11/09/21 2321

## 2021-11-09 NOTE — ED Triage Notes (Signed)
Right hand hurting, after punching a punching bag.

## 2021-11-09 NOTE — Discharge Instructions (Addendum)
Your x-ray today showed no evidence of fracture, likely or sprain or strain.  Use ibuprofen and Tylenol, ice and avoid using the punching bag for a few days until pain resolves.

## 2021-11-14 ENCOUNTER — Ambulatory Visit (INDEPENDENT_AMBULATORY_CARE_PROVIDER_SITE_OTHER): Payer: Managed Care, Other (non HMO) | Admitting: Sports Medicine

## 2021-11-14 VITALS — BP 130/82 | Ht 71.0 in | Wt 185.0 lb

## 2021-11-14 DIAGNOSIS — M5010 Cervical disc disorder with radiculopathy, unspecified cervical region: Secondary | ICD-10-CM | POA: Diagnosis not present

## 2021-11-14 MED ORDER — GABAPENTIN 300 MG PO CAPS: 300.0000 mg | ORAL_CAPSULE | Freq: Three times a day (TID) | ORAL | 0 refills | Status: DC

## 2021-11-14 NOTE — Progress Notes (Addendum)
PCP: Blair Heys, MD  Subjective:   HPI: Patient is a 51 y.o. male here for follow up.  Left sided cervical radiculopathy Seen on 10/24/21 for left sided shoulder/neck pain minutes with left sided cervical radiculopathy.  He was started on gabepentin 300mg  at night to help with neuropathic pain and sleep along with exercises, tylenol and ibuprofen PRN. Presents today for follow up.  Unfortunately he reports worsening of left-sided arm pain and numbness.  This is affecting his sleep over the past few weeks (usually sleeps prone).  Symptoms are worse when sitting down and also with certain positions of neck rotation.  The arm pain and numbness is now progressing more distally down the left forearm and affecting the fingers.  Patient is going on a work trip to [place] and [place] over the next 2 weeks and is concerned that the pain may worsen during travel.  Denies weakness or loss of sensation in upper extremities.  He continues to do some workouts including weightlifting.  C-spine x-ray on 10/31/2021 shows anterior osteophytosis at C6-C7 and joint space narrowing at C5-C6 and C7-C8  Past Medical History:  Diagnosis Date   Back pain    Hypercholesteremia     Current Outpatient Medications on File Prior to Visit  Medication Sig Dispense Refill   anastrozole (ARIMIDEX) 1 MG tablet Take 1 mg by mouth once a week.      clomiPHENE (CLOMID) 50 MG tablet Take 50 mg by mouth. 4 TIMES A WEEK     diltiazem (CARDIZEM) 30 MG tablet Take 1 tablet every 4 hours AS NEEDED for afib heart rate >100 45 tablet 1   finasteride (PROPECIA) 1 MG tablet Take 1 mg by mouth daily.      ibuprofen (ADVIL,MOTRIN) 200 MG tablet Take 200 mg by mouth as needed.      losartan (COZAAR) 100 MG tablet Take 1 tablet (100 mg total) by mouth daily. 90 tablet 3   rosuvastatin (CRESTOR) 20 MG tablet TAKE 1 TABLET BY MOUTH EVERY DAY 90 tablet 3   No current facility-administered medications on file prior to visit.    Past  Surgical History:  Procedure Laterality Date   KNEE SURGERY Right    right meniscus    No Known Allergies  Social History   Socioeconomic History   Marital status: Married    Spouse name: Not on file   Number of children: Not on file   Years of education: Not on file   Highest education level: Not on file  Occupational History   Not on file  Tobacco Use   Smoking status: Never   Smokeless tobacco: Never  Vaping Use   Vaping Use: Never used  Substance and Sexual Activity   Alcohol use: Yes   Drug use: No   Sexual activity: Not on file  Other Topics Concern   Not on file  Social History Narrative   Not on file   Social Determinants of Health   Financial Resource Strain: Not on file  Food Insecurity: Not on file  Transportation Needs: Not on file  Physical Activity: Not on file  Stress: Not on file  Social Connections: Not on file  Intimate Partner Violence: Not on file    Family History  Problem Relation Age of Onset   Colon cancer Father    Hypertension Father    Heart attack Paternal Grandfather 64    BP 130/82   Ht 5\' 11"  (1.803 m)   Wt 185 lb (83.9 kg)  BMI 25.80 kg/m      02/21/2020    1:28 PM 03/06/2020    8:58 AM  [facility] Adult Exercise  Frequency of aerobic exercise (# of days/week) 3 3  Average time in minutes 60 60  Frequency of strengthening activities (# of days/week) 3 3        No data to display          Review of Systems: See HPI above.     Objective:  Physical Exam:  Gen: NAD, comfortable in exam room  Neck Inspection : Normal skin, cervical spine with normal alignment and no deformity.   Palpation: no tenderness to vertebral process palpation.  Paraspinous muscles are not tender and without spasm.  Strength: 5/5 strength bilateral upper extremities Neurovascular: Neurovascularly intact, sensation intact across C5-C6 nerve root distribution, slightly diminished left-sided biceps reflex 1+, 2+ triceps  reflex compared to right sided reflexes which were 2+  Assessment & Plan:  1.  Worsening left sided cervical radiculopathy. Minimal improvement with gabapentin 300 mg and isometric neck exercises.  Recommended increasing gabapentin dose to 300 mg 3 times daily and titrating upwards to 600 mg 3 times daily within the next 2 weeks (provided patient instructions).  Continue Tylenol and ibuprofen as needed for pain.  Will order MRI C-spine without contrast for further evaluation.  Follow-up with Dr. Darrick Penna after MRI.  I observed and examined the patient with the resident and agree with assessment and plan.  Note reviewed and modified by me. Sterling Big, MD

## 2021-11-14 NOTE — Patient Instructions (Signed)
Thank you for coming to see me today. It was a pleasure. Today we discussed your neck and left arm pain is likely due to some wear and tear of the joint at the C-spine. I recommend increasing gabapentin to 300 mg 3 times a day initially.  If you have no improvement in symptoms please see the directions below to titrate gabapentin dose up  If no improvement in symptoms take 300 mg morning, 300 mg afternoon, 600 mg evening for 3 days If no improvement in symptoms take 300 mg morning, 600 mg afternoon, 600 mg evening for 3 days If no improvement in symptoms takes 600 mg in the morning, 600 mg in the afternoon, 600 mg in the evening  We will arrange an MRI of your C-spine Continue exercises at home Continue ibuprofen as needed for pain  Please follow-up with Dr. Jettie Booze after MRI results  Best wishes,   Dr Allena Katz

## 2021-11-29 ENCOUNTER — Encounter: Payer: Self-pay | Admitting: Sports Medicine

## 2021-12-03 ENCOUNTER — Other Ambulatory Visit: Payer: Managed Care, Other (non HMO)

## 2021-12-03 ENCOUNTER — Ambulatory Visit: Payer: Managed Care, Other (non HMO) | Admitting: Sports Medicine

## 2021-12-07 ENCOUNTER — Other Ambulatory Visit: Payer: Self-pay | Admitting: Cardiology

## 2021-12-12 ENCOUNTER — Ambulatory Visit: Payer: Managed Care, Other (non HMO) | Admitting: Sports Medicine

## 2021-12-23 ENCOUNTER — Other Ambulatory Visit: Payer: Self-pay | Admitting: *Deleted

## 2021-12-23 MED ORDER — GABAPENTIN 300 MG PO CAPS: 300.0000 mg | ORAL_CAPSULE | Freq: Three times a day (TID) | ORAL | 0 refills | Status: DC

## 2021-12-25 ENCOUNTER — Ambulatory Visit
Admission: RE | Admit: 2021-12-25 | Discharge: 2021-12-25 | Disposition: A | Discharge status: Home / Self Care | Payer: Managed Care, Other (non HMO) | Source: Ambulatory Visit | Attending: Sports Medicine | Admitting: Sports Medicine

## 2021-12-25 DIAGNOSIS — M5010 Cervical disc disorder with radiculopathy, unspecified cervical region: Secondary | ICD-10-CM

## 2021-12-31 ENCOUNTER — Encounter: Payer: Self-pay | Admitting: *Deleted

## 2022-01-18 ENCOUNTER — Other Ambulatory Visit: Payer: Self-pay | Admitting: Cardiology

## 2022-02-22 ENCOUNTER — Other Ambulatory Visit: Payer: Self-pay | Admitting: Cardiology

## 2022-03-19 ENCOUNTER — Other Ambulatory Visit: Payer: Self-pay | Admitting: Cardiology

## 2022-04-26 ENCOUNTER — Other Ambulatory Visit: Payer: Self-pay | Admitting: Cardiology

## 2022-05-15 ENCOUNTER — Other Ambulatory Visit: Payer: Self-pay | Admitting: Cardiology

## 2022-06-05 ENCOUNTER — Encounter (HOSPITAL_COMMUNITY): Payer: Self-pay | Admitting: *Deleted

## 2022-09-25 ENCOUNTER — Other Ambulatory Visit: Payer: Self-pay | Admitting: Orthopedic Surgery

## 2022-09-25 DIAGNOSIS — M25561 Pain in right knee: Secondary | ICD-10-CM

## 2022-09-29 ENCOUNTER — Other Ambulatory Visit: Payer: Self-pay | Admitting: Orthopedic Surgery

## 2022-09-29 ENCOUNTER — Ambulatory Visit
Admission: RE | Admit: 2022-09-29 | Discharge: 2022-09-29 | Disposition: A | Discharge status: Home / Self Care | Payer: Managed Care, Other (non HMO) | Source: Ambulatory Visit | Attending: Orthopedic Surgery | Admitting: Orthopedic Surgery

## 2022-09-29 DIAGNOSIS — R531 Weakness: Secondary | ICD-10-CM

## 2022-09-29 DIAGNOSIS — M7989 Other specified soft tissue disorders: Secondary | ICD-10-CM

## 2022-09-29 DIAGNOSIS — M25561 Pain in right knee: Secondary | ICD-10-CM

## 2022-10-31 ENCOUNTER — Encounter: Payer: Self-pay | Admitting: Acute Care

## 2022-10-31 ENCOUNTER — Ambulatory Visit (INDEPENDENT_AMBULATORY_CARE_PROVIDER_SITE_OTHER): Payer: Managed Care, Other (non HMO) | Admitting: Acute Care

## 2022-10-31 VITALS — BP 134/68 | HR 65 | Temp 98.2°F | Ht 71.0 in | Wt 195.0 lb

## 2022-10-31 DIAGNOSIS — I10 Essential (primary) hypertension: Secondary | ICD-10-CM

## 2022-10-31 DIAGNOSIS — G4733 Obstructive sleep apnea (adult) (pediatric): Secondary | ICD-10-CM | POA: Diagnosis not present

## 2022-10-31 DIAGNOSIS — I4891 Unspecified atrial fibrillation: Secondary | ICD-10-CM

## 2022-10-31 NOTE — Patient Instructions (Addendum)
It is good to see you today. We will Send you to the sleep lab for a mask fitting. ( ASAP)  Lets do a trial of melatonin 5 mg  continuous release + CPAP  See if you can wear your CPAP so we can get 2 weeks of data to evaluate. If you continue to have issues despite the melatonin, we  will refer you to a dentist for an oral appliance. We can then do a sleep study to see if this is controlling your OSA. If we cannot get a download, we will have you take your device to [facility] so we can have them interrogate the device and send [place] a DownLoad.  Follow up in 2-3 weeks with Dr. Wynona Neat or APP  with down load to see if when you wear CPAP you are controlling your OSA.  Please contact office for sooner follow up if symptoms do not improve or worsen or seek emergency care

## 2022-10-31 NOTE — Progress Notes (Signed)
History of Present Illness Mathew Davila is a 52 y.o. male with moderate OSA, PAF and Low -T, HTN. He is followed by Dr. Vassie Loll. He has not been seen in the office since 09/2020.  Synopsis  Pt. Is a 52 year old male with paroxysmal atrial fibrillation and HTN  referred by cardiology to evaluate for obstructive sleep apnea in 2022. OSA was diagnosed by a sleep study done 12/2019 which showed moderate OSA with AHI 19/hour, worse in the supine position 33/hour.  Epworth sleepiness score was 10. He was prescribed a CPAP for therapy. Per Dr. Reginia Naas note patient was supposed to return in 3 months, and he never did.  He has recently had a physical, which he feels did not go well, and he wants to get serious about managing his OSA.     10/31/2022 Pt. Presents for follow up. He was last at the office 09/2020. He does not wear the CPAP frequently as he never feels comfortable using it, and it disturbs his sleep. He did not follow up with  the office and let Dr. Vassie Loll know he was having problems as he had been asked to do.  Pt. States he wants to become more compliant with his therapy. He has underlying health concerns that he is aware are made worse by untreated OSA. Epworth today is 6 today. He states he wakes up about 3 times a night. Sleep quality is poor. He feels exhausted by 11 am daily. 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 Most recent mask was a nasal pillow. He is committed to making CPAP or an oral appliance work for him. He is not currently interested in [facility] as an option for treatment.   Test Results: HST 04/2019 AHI 19/hour, supine AHI 33/hour      Latest Ref Rng & Units 06/08/2021    9:37 AM 10/02/2019    3:49 PM  CBC  WBC 4.0 - 10.5 K/uL 5.0  6.9   Hemoglobin 13.0 - 17.0 g/dL 16.1  09.6   Hematocrit 39.0 - 52.0 % 41.6  48.9   Platelets 150 - 400 K/uL 146  171        Latest Ref Rng & Units 06/08/2021    9:37 AM 12/21/2020    8:13 AM  10/20/2019    9:30 AM  BMP  Glucose 70 - 99 mg/dL 98  045  409   BUN 6 - 20 mg/dL 12  15  11    Creatinine 0.61 - 1.24 mg/dL 8.11  9.14  7.82   BUN/Creat Ratio 9 - 20  14    Sodium 135 - 145 mmol/L 141  140  142   Potassium 3.5 - 5.1 mmol/L 4.4  4.7  4.4   Chloride 98 - 111 mmol/L 105  103  106   CO2 22 - 32 mmol/L 28  23  27    Calcium 8.9 - 10.3 mg/dL 9.0  9.1  8.7     BNP No results found for: "BNP"  ProBNP No results found for: "PROBNP"  PFT No results found for: "FEV1PRE", "FEV1POST", "FVCPRE", "FVCPOST", "TLC", "DLCOUNC", "PREFEV1FVCRT", "PSTFEV1FVCRT"  No results found.   Past medical hx Past Medical History:  Diagnosis Date   Back pain    Hypercholesteremia      Social History   Tobacco Use   Smoking status: Never   Smokeless tobacco: Never  Vaping Use   Vaping Use: Never used  Substance  Use Topics   Alcohol use: Yes   Drug use: No    Mr.Ferrara reports that he has never smoked. He has never used smokeless tobacco. He reports current alcohol use. He reports that he does not use drugs.  Tobacco Cessation: Never smoker   Past surgical hx, Family hx, Social hx all reviewed.  Current Outpatient Medications on File Prior to Visit  Medication Sig   anastrozole (ARIMIDEX) 1 MG tablet Take 1 mg by mouth once a week.    clomiPHENE (CLOMID) 50 MG tablet Take 50 mg by mouth. 4 TIMES A WEEK   finasteride (PROPECIA) 1 MG tablet Take 1 mg by mouth daily.    ibuprofen (ADVIL,MOTRIN) 200 MG tablet Take 200 mg by mouth as needed.    irbesartan (AVAPRO) 300 MG tablet Take 300 mg by mouth daily.   rosuvastatin (CRESTOR) 20 MG tablet TAKE 1 TABLET BY MOUTH EVERY DAY   diltiazem (CARDIZEM) 30 MG tablet Take 1 tablet every 4 hours AS NEEDED for afib heart rate >100 (Patient not taking: Reported on 10/31/2022)   No current facility-administered medications on file prior to visit.     No Known Allergies  Review Of Systems:  Constitutional:   No  weight loss, night  sweats,  Fevers, chills,+ fatigue, or no lassitude.  HEENT:   No headaches,  Difficulty swallowing,  Tooth/dental problems, or  Sore throat,                No sneezing, itching, ear ache, nasal congestion, post nasal drip,   CV:  No chest pain,  Orthopnea, PND, swelling in lower extremities, anasarca, dizziness, palpitations, syncope.   GI  No heartburn, indigestion, abdominal pain, nausea, vomiting, diarrhea, change in bowel habits, loss of appetite, bloody stools.   Resp: No shortness of breath with exertion or at rest.  No excess mucus, no productive cough,  No non-productive cough,  No coughing up of blood.  No change in color of mucus.  No wheezing.  No chest wall deformity  Skin: no rash or lesions.  GU: no dysuria, change in color of urine, no urgency or frequency.  No flank pain, no hematuria   MS:  No joint pain or swelling.  + decreased range of motion 2/2 knee issues.  No back pain.  Psych:  No change in mood or affect. No depression or anxiety.  No memory loss.   Vital Signs BP 134/68 (BP Location: Left Arm, Patient Position: Sitting, Cuff Size: Normal)   Pulse 65   Temp 98.2 F (36.8 C) (Oral)   Ht 5\' 11"  (1.803 m)   Wt 195 lb (88.5 kg)   SpO2 98%   BMI 27.20 kg/m    Physical Exam:  General- No distress,  A&Ox3, pleasant ENT: No sinus tenderness, TM clear, pale nasal mucosa, no oral exudate,no post nasal drip, no LAN Cardiac: S1, S2, regular rate and rhythm, no murmur Chest: No wheeze/ rales/ dullness; no accessory muscle use, no nasal flaring, no sternal retractions Abd.: Soft Non-tender, ND, BS +, Body mass index is 27.2 kg/m.  Ext: No clubbing cyanosis, edema Neuro:  normal strength, MAE x 4, A&O x 3 Skin: No rashes, warm and dry, no lesions  Psych: normal mood and behavior   Assessment/Plan Moderate OSA Non - Compliant with CPAP due to mask issues and stomach sleeper HTN and a fib Plan We will Send you to the sleep lab for a mask fitting. ( ASAP)   Lets do a trial of  melatonin 5 mg  continuous release + CPAP  See if you can wear your CPAP so we can get 2 weeks of data to evaluate. If you continue to have issues despite the melatonin, we  will refer you to a dentist for an oral appliance. We can then do a sleep study to see if this is controlling your OSA. If we cannot get a download, we will have you take your device to [facility] so we can have them interrogate the device and send [place] a DownLoad.  Follow up in 2-3 weeks with Dr. Wynona Neat or APP  with down load to see if when you wear CPAP you are controlling your OSA. He needs short term follow up and Dr. Reginia Naas schedule makes this a bit more difficult. Please contact office for sooner follow up if symptoms do not improve or worsen or seek emergency care    I spent 35 minutes dedicated to the care of this patient on the date of this encounter to include pre-visit review of records, face-to-face time with the patient discussing conditions above, post visit ordering of testing, clinical documentation with the electronic health record, making appropriate referrals as documented, and communicating necessary information to the patient's healthcare team.    Bevelyn Ngo, NP 10/31/2022  1:13 PM

## 2022-11-05 ENCOUNTER — Telehealth: Payer: Self-pay | Admitting: Acute Care

## 2022-11-05 NOTE — Telephone Encounter (Signed)
Pt calling in bc someone from the sleep lab was supposed to call for a mask fitting  Pt wants referral to dentist for oral compliance

## 2022-11-06 NOTE — Telephone Encounter (Signed)
Pt calling back to see about getting a referral to a dentist for oral device

## 2022-11-11 ENCOUNTER — Other Ambulatory Visit: Payer: Self-pay | Admitting: *Deleted

## 2022-11-14 NOTE — Telephone Encounter (Signed)
See MyChart encounter from 11/11/22. Will close this encounter.

## 2022-11-20 ENCOUNTER — Telehealth: Payer: Self-pay | Admitting: Acute Care

## 2022-11-20 DIAGNOSIS — G4733 Obstructive sleep apnea (adult) (pediatric): Secondary | ICD-10-CM

## 2022-11-24 NOTE — Telephone Encounter (Signed)
Called patient and left a detailed message to let him know a referral has been placed per his request for oral appliance. I have also sent a message to patient through my chart.     LOV 10/31/22 with Maralyn Sago, NP  It is good to see you today. We will Send you to the sleep lab for a mask fitting. ( ASAP)  Lets do a trial of melatonin 5 mg  continuous release + CPAP  See if you can wear your CPAP so we can get 2 weeks of data to evaluate. If you continue to have issues despite the melatonin, we  will refer you to a dentist for an oral appliance. We can then do a sleep study to see if this is controlling your OSA. If we cannot get a download, we will have you take your device to [facility] so we can have them interrogate the device and send [place] a DownLoad.  Follow up in 2-3 weeks with Dr. Wynona Neat or APP  with down load to see if when you wear CPAP you are controlling your OSA.  Please contact office for sooner follow up if symptoms do not improve or worsen or seek emergency care

## 2022-11-25 ENCOUNTER — Ambulatory Visit (HOSPITAL_BASED_OUTPATIENT_CLINIC_OR_DEPARTMENT_OTHER): Payer: Managed Care, Other (non HMO) | Attending: Acute Care | Admitting: Radiology

## 2022-11-25 DIAGNOSIS — G4733 Obstructive sleep apnea (adult) (pediatric): Secondary | ICD-10-CM

## 2023-05-21 ENCOUNTER — Other Ambulatory Visit: Payer: Self-pay | Admitting: Cardiology

## 2023-05-26 ENCOUNTER — Telehealth: Payer: Self-pay | Admitting: Cardiology

## 2023-05-26 MED ORDER — ROSUVASTATIN CALCIUM 20 MG PO TABS: 20.0000 mg | ORAL_TABLET | Freq: Every day | ORAL | 0 refills | Status: DC

## 2023-05-26 NOTE — Telephone Encounter (Signed)
*  STAT* If patient is at the pharmacy, call can be transferred to refill team.   1. Which medications need to be refilled? (please list name of each medication and dose if known) rosuvastatin (CRESTOR) 20 MG tablet    2. Would you like to learn more about the convenience, safety, & potential cost savings by using the [facility]?      3. Are you open to using the [facility] (Type [facility]. ).   4. Which pharmacy/location (including street and city if local pharmacy) is medication to be sent to? [id number] - , [place] - [address]    5. Do they need a 30 day or 90 day supply? 90

## 2023-05-26 NOTE — Telephone Encounter (Signed)
Pt's medication was sent to pt's pharmacy as requested. Confirmation received.

## 2023-06-03 ENCOUNTER — Other Ambulatory Visit: Payer: Self-pay | Admitting: Cardiology

## 2023-09-01 NOTE — Progress Notes (Signed)
 HPI: Follow-up atrial fibrillation.  Stress echocardiogram March 2019 normal.  Patient seen June 2021 after experiencing an episode of palpitations.  His apple watch apparently showed atrial fibrillation and the strips were reviewed by Tera Fellows and atrial fibrillation confirmed.  Patient converted spontaneously.  Echocardiogram June 2021 showed normal LV systolic function, grade 1 diastolic dysfunction, mild left ventricular hypertrophy, mild mitral regurgitation secondary to bileaflet mitral valve prolapse.  Patient was given Cardizem  to take as needed.  Calcium  score May 2022 0.  Since last seen Sep 18, 2020 he denies dyspnea on exertion, orthopnea, PND, pedal edema or chest pain.  Question episode of atrial fibrillation approximately 1 year ago.  He does note some fatigue.  Current Outpatient Medications  Medication Sig Dispense Refill   anastrozole (ARIMIDEX) 1 MG tablet Take 1 mg by mouth once a week.      clomiPHENE (CLOMID) 50 MG tablet Take 25 mg by mouth daily. 4 TIMES A WEEK     diltiazem  (CARDIZEM ) 30 MG tablet Take 1 tablet every 4 hours AS NEEDED for afib heart rate >100 45 tablet 1   finasteride (PROPECIA) 1 MG tablet Take 1 mg by mouth daily.      ibuprofen (ADVIL,MOTRIN) 200 MG tablet Take 200 mg by mouth as needed.      irbesartan (AVAPRO) 300 MG tablet Take 300 mg by mouth daily.     meloxicam (MOBIC) 15 MG tablet Take 15 mg by mouth daily.     rosuvastatin  (CRESTOR ) 20 MG tablet TAKE 1 TABLET (20 MG TOTAL) BY MOUTH DAILY. KEEP OV. 15 tablet 0   tadalafil (CIALIS) 10 MG tablet Take 10 mg by mouth daily as needed.     No current facility-administered medications for this visit.     Past Medical History:  Diagnosis Date   Back pain    Hypercholesteremia     Past Surgical History:  Procedure Laterality Date   KNEE SURGERY Right    right meniscus    Social History   Socioeconomic History   Marital status: Married    Spouse name: Not on file   Number of  children: Not on file   Years of education: Not on file   Highest education level: Not on file  Occupational History   Not on file  Tobacco Use   Smoking status: Never   Smokeless tobacco: Never  Vaping Use   Vaping status: Never Used  Substance and Sexual Activity   Alcohol use: Yes   Drug use: No   Sexual activity: Not on file  Other Topics Concern   Not on file  Social History Narrative   Not on file   Social Drivers of Health   Financial Resource Strain: Not on file  Food Insecurity: Not on file  Transportation Needs: Not on file  Physical Activity: Not on file  Stress: Not on file  Social Connections: Not on file  Intimate Partner Violence: Not on file    Family History  Problem Relation Age of Onset   Colon cancer Father    Hypertension Father    Heart attack Paternal Grandfather 99    ROS: no fevers or chills, productive cough, hemoptysis, dysphasia, odynophagia, melena, hematochezia, dysuria, hematuria, rash, seizure activity, orthopnea, PND, pedal edema, claudication. Remaining systems are negative.  Physical Exam: Well-developed well-nourished in no acute distress.  Skin is warm and dry.  HEENT is normal.  Neck is supple.  Chest is clear to auscultation with normal expansion.  Cardiovascular exam is regular rate and rhythm.  Abdominal exam nontender or distended. No masses palpated. Extremities show no edema. neuro grossly intact  EKG Interpretation Date/Time:  Tuesday Sep 15 2023 08:18:46 EDT Ventricular Rate:  54 PR Interval:  136 QRS Duration:  100 QT Interval:  448 QTC Calculation: 424 R Axis:   -44  Text Interpretation: Sinus bradycardia with sinus arrhythmia Left axis deviation Minimal voltage criteria for LVH, may be normal variant ( Cornell product ) Nonspecific T wave abnormality Confirmed by Alexandria Angel ([id number]) on 09/15/2023 8:22:21 AM    A/P  1 paroxysmal atrial fibrillation-patient remains in sinus rhythm.  Will continue  Cardizem  as needed.  CHA2DS2-VASc is 1 for hypertension.  Resume aspirin  81 mg daily.  He prefers this over apixaban.  If he has more frequent episodes in the future we will consider referral for ablation versus antiarrhythmic.  2 hypertension-patient's blood pressure is elevated; add chlorthalidone 25 mg daily.  Check potassium and renal function 1 week.  Follow blood pressure at home and adjust regimen as needed.  3 history of bileaflet mitral valve prolapse with mild mitral regurgitation-plan follow-up echocardiogram.  4 history of sleep apnea-we will repeat sleep study as he does snore.  If he does have sleep apnea we will refer to pulmonary.  5 family history of coronary artery disease-we will repeat calcium  score.  6 hyperlipidemia-continue statin.  Recent laboratories showed LDL 80 and normal LP(a).  If calcium  demonstrated on calcium  score will increase dose of statin.  Alexandria Angel, MD

## 2023-09-07 ENCOUNTER — Encounter: Payer: Self-pay | Admitting: Cardiology

## 2023-09-09 ENCOUNTER — Other Ambulatory Visit: Payer: Self-pay | Admitting: Cardiology

## 2023-09-15 ENCOUNTER — Encounter: Payer: Self-pay | Admitting: Cardiology

## 2023-09-15 ENCOUNTER — Telehealth: Payer: Self-pay | Admitting: *Deleted

## 2023-09-15 ENCOUNTER — Ambulatory Visit: Payer: Managed Care, Other (non HMO) | Attending: Cardiology | Admitting: Cardiology

## 2023-09-15 VITALS — BP 174/88 | HR 54 | Ht 71.0 in | Wt 193.0 lb

## 2023-09-15 DIAGNOSIS — I480 Paroxysmal atrial fibrillation: Secondary | ICD-10-CM | POA: Diagnosis not present

## 2023-09-15 DIAGNOSIS — I10 Essential (primary) hypertension: Secondary | ICD-10-CM | POA: Diagnosis not present

## 2023-09-15 DIAGNOSIS — I341 Nonrheumatic mitral (valve) prolapse: Secondary | ICD-10-CM | POA: Diagnosis not present

## 2023-09-15 DIAGNOSIS — Z09 Encounter for follow-up examination after completed treatment for conditions other than malignant neoplasm: Secondary | ICD-10-CM | POA: Diagnosis not present

## 2023-09-15 MED ORDER — ASPIRIN 81 MG PO TBEC: 81.0000 mg | DELAYED_RELEASE_TABLET | Freq: Every day | ORAL | Status: AC

## 2023-09-15 MED ORDER — CHLORTHALIDONE 25 MG PO TABS: 25.0000 mg | ORAL_TABLET | Freq: Every day | ORAL | 3 refills | Status: DC

## 2023-09-15 NOTE — Telephone Encounter (Signed)
 DR. Chalmers [place] TODAY.  Patient agreement reviewed and signed on 09/15/2023.  WatchPAT issued to patient on 09/15/2023 by Alec Huntington, CMA. Patient aware to not open the WatchPAT box until contacted with the activation PIN. Patient profile initialized in CloudPAT on 09/15/2023 by Alec Huntington, CMA. Device serial number: [id number]  Please list Reason for Call as Advice Only and type "WatchPAT issued to patient" in the comment box.

## 2023-09-15 NOTE — Patient Instructions (Addendum)
 Medication Instructions:   Chlorthalidone 12.5 mg  ( 1/2 tablet daily)  Aspirin  81 mg  daily  *If you need a refill on your cardiac medications before your next appointment, please call your pharmacy*   Lab Work: BMP in 1 week  If you have labs (blood work) drawn today and your tests are completely normal, you will receive your results only by: MyChart Message (if you have MyChart) OR A paper copy in the mail If you have any lab test that is abnormal or we need to change your treatment, we will call you to review the results.   Testing/Procedures: ECHO Your physician has requested that you have an echocardiogram. Echocardiography is a painless test that uses sound waves to create images of your heart. It provides your doctor with information about the size and shape of your heart and how well your heart's chambers and valves are working. This procedure takes approximately one hour. There are no restrictions for this procedure. Please do NOT wear cologne, perfume, aftershave, or lotions (deodorant is allowed). Please arrive 15 minutes prior to your appointment time.  Please note: We ask at that you not bring children with you during ultrasound (echo/ vascular) testing. Due to room size and safety concerns, children are not allowed in the ultrasound rooms during exams. Our front office staff cannot provide observation of children in our lobby area while testing is being conducted. An adult accompanying a patient to their appointment will only be allowed in the ultrasound room at the discretion of the ultrasound technician under special circumstances. We apologize for any inconvenience.  Your physician has recommended that you have a  home sleep study. This test records several body functions during sleep, including: brain activity, eye movement, oxygen and carbon dioxide blood levels, heart rate and rhythm, breathing rate and rhythm, the flow of air through your mouth and nose, snoring, body  muscle movements, and chest and belly movement.    Follow-Up: At [facility], you and your health needs are our priority.  As part of our continuing mission to provide you with exceptional heart care, we have created designated Provider Care Teams.  These Care Teams include your primary Cardiologist (physician) and Advanced Practice Providers (APPs -  Physician Assistants and Nurse Practitioners) who all work together to provide you with the care you need, when you need it.     Your next appointment:   12 month(s)  The format for your next appointment:   In Person  Provider:   Alexandria Angel, MD   Other Instructions    [place] office to schedule a blood pressure check   CT coronary calcium  score.   Test locations:  [facility]  [facility] at [facility]  This is $99 out of pocket.   Coronary CalciumScan A coronary calcium  scan is an imaging test used to look for deposits of calcium  and other fatty materials (plaques) in the inner lining of the blood vessels of the heart (coronary arteries). These deposits of calcium  and plaques can partly clog and narrow the coronary arteries without producing any symptoms or warning signs. This puts a person at risk for a heart attack. This test can detect these deposits before symptoms develop. Tell a health care provider about: Any allergies you have. All medicines you are taking, including vitamins, herbs, eye drops, creams, and over-the-counter medicines. Any problems you or family members have had with anesthetic medicines. Any blood disorders you  have. Any surgeries you have had. Any medical conditions you have. Whether you are pregnant or may be pregnant. What are the risks? Generally, this is a safe procedure. However, problems may occur, including: Harm to a pregnant woman and her unborn baby. This test involves the use of radiation. Radiation  exposure can be dangerous to a pregnant woman and her unborn baby. If you are pregnant, you generally should not have this procedure done. Slight increase in the risk of cancer. This is because of the radiation involved in the test. What happens before the procedure? No preparation is needed for this procedure. What happens during the procedure? You will undress and remove any jewelry around your neck or chest. You will put on a hospital gown. Sticky electrodes will be placed on your chest. The electrodes will be connected to an electrocardiogram (ECG) machine to record a tracing of the electrical activity of your heart. A CT scanner will take pictures of your heart. During this time, you will be asked to lie still and hold your breath for 2-3 seconds while a picture of your heart is being taken. The procedure may vary among health care providers and hospitals. What happens after the procedure? You can get dressed. You can return to your normal activities. It is up to you to get the results of your test. Ask your health care provider, or the department that is doing the test, when your results will be ready. Summary A coronary calcium  scan is an imaging test used to look for deposits of calcium  and other fatty materials (plaques) in the inner lining of the blood vessels of the heart (coronary arteries). Generally, this is a safe procedure. Tell your health care provider if you are pregnant or may be pregnant. No preparation is needed for this procedure. A CT scanner will take pictures of your heart. You can return to your normal activities after the scan is done. This information is not intended to replace advice given to you by your health care provider. Make sure you discuss any questions you have with your health care provider. Document Released: 10/11/2007 Document Revised: 03/03/2016 Document Reviewed: 03/03/2016 Elsevier Interactive Patient Education  2017 [employer].       [place]?  Is a FDA cleared portable home sleep study test that uses a watch and 3 points of contact to monitor 7 different channels, including your heart rate, oxygen saturations, body position, snoring, and chest motion.  The study is easy to use from the comfort of your own home and accurately detect sleep apnea.  Before bed, you attach the chest sensor, attached the sleep apnea bracelet to your nondominant hand, and attach the finger probe.  After the study, the raw data is downloaded from the watch and scored for apnea events.   For more information: [email or web address]  Patient Testing Instructions:  Do not put battery into the device until bedtime when you are ready to begin the test. Please call the support number if you need assistance after following the instructions below: 24 hour support line- [phone] or [facility] support at [phone] (option 2)  Download the IntelWatchPAT One" app through the google play store or App Store  Be sure to turn on or enable access to bluetooth in settlings on your smartphone/ device  Make sure no other bluetooth devices are on and within the vicinity of your smartphone/ device and WatchPAT watch during testing.  Make sure to leave your smart phone/ device plugged in and charging all  night.  When ready for bed:  Follow the instructions step by step in the WatchPAT One App to activate the testing device. For additional instructions, including video instruction, visit the WatchPAT One video on Youtube. You can search for WatchPat One within Youtube (video is 4 minutes and 18 seconds) or enter: https://youtube/watch?v=BCce_vbiwxE Please note: You will be prompted to enter a Pin to connect via bluetooth when starting the test. The PIN will be assigned to you when you receive the test.  The device is disposable, but it recommended that you retain the device until you receive a call letting you know the study has been received  and the results have been interpreted.  We will let you know if the study did not transmit to us  properly after the test is completed. You do not need to call us  to confirm the receipt of the test.  Please complete the test within 48 hours of receiving PIN.   Frequently Asked Questions:  What is Watch Deatra Face one?  A single use fully disposable home sleep apnea testing device and will not need to be returned after completion.  What are the requirements to use WatchPAT one?  The be able to have a successful watchpat one sleep study, you should have your Watch pat one device, your smart phone, watch pat one app, your PIN number and Internet access What type of phone do I need?  You should have a smart phone that uses Android 5.1 and above or any Iphone with IOS 10 and above How can I download the WatchPAT one app?  Based on your device type search for WatchPAT one app either in google play for android devices or APP store for Iphone's Where will I get my PIN for the study?  Your PIN will be provided by your physician's office. It is used for authentication and if you lose/forget your PIN, please reach out to your providers office.  I do not have Internet at home. Can I do WatchPAT one study?  WatchPAT One needs Internet connection throughout the night to be able to transmit the sleep data. You can use your home/local internet or your cellular's data package. However, it is always recommended to use home/local Internet. It is estimated that between 20MB-30MB will be used with each study.However, the application will be looking for space in the phone to start the study.  What happens if I lose internet or bluetooth connection?  During the internet disconnection, your phone will not be able to transmit the sleep data. All the data, will be stored in your phone. As soon as the internet connection is back on, the phone will being sending the sleep data. During the bluetooth disconnection, WatchPAT one  will not be able to to send the sleep data to your phone. Data will be kept in the WatchPAT one until two devices have bluetooth connection back on. As soon as the connection is back on, WatchPAT one will send the sleep data to the phone.  How long do I need to wear the WatchPAT one?  After you start the study, you should wear the device at least 6 hours.  How far should I keep my phone from the device?  During the night, your phone should be within 15 feet.  What happens if I leave the room for restroom or other reasons?  Leaving the room for any reason will not cause any problem. As soon as your get back to the room, both devices will  reconnect and will continue to send the sleep data. Can I use my phone during the sleep study?  Yes, you can use your phone as usual during the study. But it is recommended to put your watchpat one on when you are ready to go to bed.  How will I get my study results?  A soon as you completed your study, your sleep data will be sent to the provider. They will then share the results with you when they are ready.

## 2023-09-23 ENCOUNTER — Ambulatory Visit (HOSPITAL_BASED_OUTPATIENT_CLINIC_OR_DEPARTMENT_OTHER)
Admission: RE | Admit: 2023-09-23 | Discharge: 2023-09-23 | Disposition: A | Discharge status: Home / Self Care | Payer: Self-pay | Source: Ambulatory Visit | Attending: Cardiology | Admitting: Cardiology

## 2023-09-23 DIAGNOSIS — I341 Nonrheumatic mitral (valve) prolapse: Secondary | ICD-10-CM | POA: Insufficient documentation

## 2023-09-23 DIAGNOSIS — I480 Paroxysmal atrial fibrillation: Secondary | ICD-10-CM | POA: Insufficient documentation

## 2023-09-24 ENCOUNTER — Ambulatory Visit: Payer: Self-pay | Admitting: Cardiology

## 2023-09-24 DIAGNOSIS — I10 Essential (primary) hypertension: Secondary | ICD-10-CM

## 2023-10-05 LAB — BASIC METABOLIC PANEL WITH GFR
BUN/Creatinine Ratio: 12 (ref 9–20)
BUN: 12 mg/dL (ref 6–24)
CO2: 24 mmol/L (ref 20–29)
Calcium: 9.1 mg/dL (ref 8.7–10.2)
Chloride: 89 mmol/L — ABNORMAL LOW (ref 96–106)
Creatinine, Ser: 1.02 mg/dL (ref 0.76–1.27)
Glucose: 91 mg/dL (ref 70–99)
Potassium: 4.1 mmol/L (ref 3.5–5.2)
Sodium: 128 mmol/L — ABNORMAL LOW (ref 134–144)
eGFR: 87 mL/min/{1.73_m2} (ref >59)

## 2023-10-06 ENCOUNTER — Telehealth: Payer: Self-pay

## 2023-10-06 NOTE — Telephone Encounter (Signed)
**Note De-Identified Verley Pariseau Obfuscation** Ordering provider: Dr Audery Blazing Associated diagnoses: Snoring-R06.83 and Somnolence-R40.0  WatchPAT PA obtained on 10/06/2023 by Mathew Davila, Mathew Mao, LPN. Authorization: I called [employer] and was advised by the virtual assistant that a PA is not required for CPT Code: [id number] Confirmation #: [id number]  Patient notified of PIN (1234) on 10/06/2023 Mathew Davila Notification Method: MyChart message.  Phone note routed to covering staff for follow-up.

## 2023-10-07 MED ORDER — CHLORTHALIDONE 25 MG PO TABS: 12.5000 mg | ORAL_TABLET | Freq: Every day | ORAL | Status: DC

## 2023-10-07 NOTE — Telephone Encounter (Signed)
 Spoke with pt, aware of dr Lourdes Roy recommendations. Lab orders mailed to the pt

## 2023-10-13 ENCOUNTER — Other Ambulatory Visit: Payer: Self-pay | Admitting: Cardiology

## 2023-10-26 ENCOUNTER — Ambulatory Visit: Attending: Cardiology | Admitting: *Deleted

## 2023-10-26 ENCOUNTER — Ambulatory Visit (HOSPITAL_COMMUNITY)
Admission: RE | Admit: 2023-10-26 | Discharge: 2023-10-26 | Disposition: A | Discharge status: Home / Self Care | Source: Ambulatory Visit | Attending: Cardiovascular Disease | Admitting: Cardiovascular Disease

## 2023-10-26 DIAGNOSIS — I480 Paroxysmal atrial fibrillation: Secondary | ICD-10-CM

## 2023-10-26 DIAGNOSIS — I10 Essential (primary) hypertension: Secondary | ICD-10-CM

## 2023-10-26 DIAGNOSIS — I341 Nonrheumatic mitral (valve) prolapse: Secondary | ICD-10-CM | POA: Diagnosis not present

## 2023-10-26 LAB — ECHOCARDIOGRAM COMPLETE
Area-P 1/2: 3.23 cm2
S' Lateral: 3.5 cm

## 2023-10-26 NOTE — Patient Instructions (Addendum)
   Nurse Visit   Date of Encounter: 10/26/2023 ID: Mathew Davila, DOB 02/26/1971, MRN [id number]  PCP:  Hugh Charleston, MD (Inactive)   [facility] Providers Cardiologist:  Redell Shallow, MD      Visit Details   VS:  BP (!) 148/74 (BP Location: Right Arm, Patient Position: Sitting, Cuff Size: Normal)  , BMI There is no height or weight on file to calculate BMI.  Wt Readings from Last 3 Encounters:  09/15/23 193 lb (87.5 kg)  11/25/22 195 lb (88.5 kg)  10/31/22 195 lb (88.5 kg)     Reason for visit: bp check Performed today: Blood pressure check. Dr. Jeffrie, DOD made aware Changes (medications, testing, etc.) : no Length of Visit: 10 minutes    Medications Adjustments/Labs and Tests Ordered: Patient here today for Blood pressure check, ordered by Dr, Dr. Shallow. Patient denies any discomfort at this,. Patient verbalized taking blood pressure  and medications as ordered.   Bonney Trudy Frankey JONELLE, RN  10/26/2023 11:39 AM

## 2023-10-28 NOTE — Telephone Encounter (Signed)
 Spoke with pt, he is aware he need to complete but having some issues and trying to work through those to be able to do the testing.

## 2023-11-02 ENCOUNTER — Encounter (INDEPENDENT_AMBULATORY_CARE_PROVIDER_SITE_OTHER): Payer: Self-pay | Admitting: Cardiology

## 2023-11-02 DIAGNOSIS — G4733 Obstructive sleep apnea (adult) (pediatric): Secondary | ICD-10-CM | POA: Diagnosis not present

## 2023-11-08 NOTE — Procedures (Signed)
   SLEEP STUDY REPORT Patient Information Study Date: 11/02/2023 Patient Name: Mathew Davila Patient ID: [id number] Birth Date: Sep 08, 1970 Age: 53 Gender: Male BMI: 27.2 (W=194 lb, H=5' 11'') Stopbang: 7 Referring Physician: Redell Shallow, MD  TEST DESCRIPTION: Home sleep apnea testing was completed using the WatchPat, a Type 1 device, utilizing peripheral arterial tonometry (PAT), chest movement, actigraphy, pulse oximetry, pulse rate, body position and snore. AHI was calculated with apnea and hypopnea using valid sleep time as the denominator. RDI includes apneas, hypopneas, and RERAs. The data acquired and the scoring of sleep and all associated events were performed in accordance with the recommended standards and specifications as outlined in the AASM Manual for the Scoring of Sleep and Associated Events 2.2.0 (2015).  FINDINGS:  1. Mild Obstructive Sleep Apnea with AHI 12.8/hr overall and moderate during REM sleep with REM AHI 25.7/hr.  2. No Central Sleep Apnea with pAHIc 0.3/hr.  3. Oxygen desaturations as low as 85%.  4. Severe snoring was present. O2 sats were < 88% for 0.2 min.  5. Total sleep time was 6 hrs and 54 min.  6. 20.4% of total sleep time was spent in REM sleep.  7. Shortened sleep onset latency at 6 min.  8. Prolonged REM sleep onset latency at 101 min.  9. Total awakenings were 17. 10. Arrhythmia detection: None  DIAGNOSIS: Mild to Moderate Obstructive Sleep Apnea (G47.33)  RECOMMENDATIONS: 1. Clinical correlation of these findings is necessary. The decision to treat obstructive sleep apnea (OSA) is usually based on the presence of apnea symptoms or the presence of associated medical conditions such as Hypertension, Congestive Heart Failure, Atrial Fibrillation or Obesity. The most common symptoms of OSA are snoring, gasping for breath while sleeping, daytime sleepiness and fatigue. 2. Initiating apnea therapy is recommended given the presence of  symptoms and/or associated conditions. Recommend proceeding with one of the following:  a. Auto-CPAP therapy with a pressure range of 5-20cm H2O.  b. An oral appliance (OA) that can be obtained from certain dentists with expertise in sleep medicine. These are primarily of use in non-obese patients with mild and moderate disease.  c. An ENT consultation which may be useful to look for specific causes of obstruction and possible treatment options.  d. If patient is intolerant to PAP therapy, consider referral to ENT for evaluation for hypoglossal nerve stimulator. 3. Close follow-up is necessary to ensure success with CPAP or oral appliance therapy for maximum benefit . 4. A follow-up oximetry study on CPAP is recommended to assess the adequacy of therapy and determine the need for supplemental oxygen or the potential need for Bi-level therapy. An arterial blood gas to determine the adequacy of baseline ventilation and oxygenation should also be considered. 5. Healthy sleep recommendations include: adequate nightly sleep (normal 7-9 hrs/night), avoidance of caffeine after noon and alcohol near bedtime, and maintaining a sleep environment that is cool, dark and quiet. 6. Weight loss for overweight patients is recommended. Even modest amounts of weight loss can significantly improve the severity of sleep apnea. 7. Snoring recommendations include: weight loss where appropriate, side sleeping, and avoidance of alcohol before bed. 8. Operation of motor vehicle should be avoided when sleepy.  Signature: Wilbert Bihari, MD; [facility]; Diplomat, American Board of Sleep Medicine Electronically Signed: 11/08/2023 6:31:34 PM

## 2023-11-09 ENCOUNTER — Telehealth: Payer: Self-pay

## 2023-11-09 ENCOUNTER — Ambulatory Visit: Attending: Cardiology

## 2023-11-09 DIAGNOSIS — I480 Paroxysmal atrial fibrillation: Secondary | ICD-10-CM

## 2023-11-09 DIAGNOSIS — I10 Essential (primary) hypertension: Secondary | ICD-10-CM

## 2023-11-09 NOTE — Telephone Encounter (Signed)
 Notified patient of sleep study results and recommendations. All questions were answered and patient verbalized understanding. Order for new device and supplies sent to [facility] today.

## 2023-11-09 NOTE — Telephone Encounter (Signed)
-----   Message from Wilbert Bihari sent at 11/08/2023  6:34 PM EDT ----- Please let patient know that they have sleep apnea and recommend treating with CPAP.  Please order an auto CPAP from 4-15cm H2O with heated humidity and mask of choice.  Order overnight pulse ox on CPAP.  Followup with me in 6 weeks.

## 2023-11-18 ENCOUNTER — Telehealth: Payer: Self-pay | Admitting: Cardiology

## 2023-11-18 NOTE — Telephone Encounter (Signed)
 Patient called to follow-up on next steps after sleep study completed.

## 2023-11-18 NOTE — Telephone Encounter (Signed)
 Spoke with pt, the dentist was working on the back of his mouth and noticed the patient was having trouble breathing from his nose and the patient reports he almost passed out. He is not having any issues with sinuses or other problems. Explained his machine for his sleep apnea has been ordered and when he sees dr shlomo, she can refer him to an ENT if needed after she examines him. Patient agrees and is going to wait to see dr turner. He was encouraged to reach out to his medical doctor if he feels he needs to see ENT prior to seeing dr turner.

## 2023-11-18 NOTE — Telephone Encounter (Signed)
 Patient stated his dentist noticed his breathing through his nose was labored during treatment.  Patient wants to get a referral to ENT.

## 2023-11-19 NOTE — Telephone Encounter (Signed)
 Returned patient call left message that it takes 15 business days to get an insurance approval and once the dme has the approval they will contact the patient.

## 2023-11-27 LAB — BASIC METABOLIC PANEL WITH GFR
BUN/Creatinine Ratio: 10 (ref 9–20)
BUN: 11 mg/dL (ref 6–24)
CO2: 24 mmol/L (ref 20–29)
Calcium: 9.5 mg/dL (ref 8.7–10.2)
Chloride: 94 mmol/L — ABNORMAL LOW (ref 96–106)
Creatinine, Ser: 1.13 mg/dL (ref 0.76–1.27)
Glucose: 95 mg/dL (ref 70–99)
Potassium: 4.5 mmol/L (ref 3.5–5.2)
Sodium: 137 mmol/L (ref 134–144)
eGFR: 77 mL/min/1.73 (ref >59)

## 2024-02-25 ENCOUNTER — Encounter: Payer: Self-pay | Admitting: Cardiology

## 2024-02-25 DIAGNOSIS — E785 Hyperlipidemia, unspecified: Secondary | ICD-10-CM

## 2024-02-26 MED ORDER — ROSUVASTATIN CALCIUM 40 MG PO TABS: 40.0000 mg | ORAL_TABLET | Freq: Every day | ORAL | 3 refills | Status: DC

## 2024-03-17 ENCOUNTER — Other Ambulatory Visit: Payer: Self-pay | Admitting: Cardiology

## 2024-03-17 DIAGNOSIS — E785 Hyperlipidemia, unspecified: Secondary | ICD-10-CM

## 2024-03-21 MED ORDER — ROSUVASTATIN CALCIUM 40 MG PO TABS: 40.0000 mg | ORAL_TABLET | Freq: Every day | ORAL | 1 refills | Status: AC

## 2024-03-21 MED ORDER — CHLORTHALIDONE 25 MG PO TABS: 12.5000 mg | ORAL_TABLET | Freq: Every day | ORAL | 1 refills | Status: AC

## 2024-03-28 ENCOUNTER — Telehealth: Payer: Self-pay | Admitting: *Deleted

## 2024-03-28 NOTE — Telephone Encounter (Signed)
 Fax came today for patient from [facility] with a AMA return notification stating the patient has returned his equipment against medical advice. Patient stated his machine did not work for him and was not able to get much sleep and he wished it worked. The return was made despite their efforts to educate the patient on the importance of continued use.

## 2024-03-31 NOTE — Telephone Encounter (Signed)
 Called [email or web address] about Dentist referral if interested.
